# Patient Record
Sex: Female | Born: 1945 | Race: White | Hispanic: No | Marital: Single | Smoking: Former smoker
Health system: Southern US, Community
[De-identification: ages and names within clinical notes are randomized; demographics above are authoritative.]

## PROBLEM LIST (undated history)

## (undated) DIAGNOSIS — F329 Major depressive disorder, single episode, unspecified: Secondary | ICD-10-CM

## (undated) DIAGNOSIS — G2581 Restless legs syndrome: Secondary | ICD-10-CM

## (undated) DIAGNOSIS — F419 Anxiety disorder, unspecified: Secondary | ICD-10-CM

## (undated) DIAGNOSIS — F32A Depression, unspecified: Secondary | ICD-10-CM

## (undated) DIAGNOSIS — K219 Gastro-esophageal reflux disease without esophagitis: Secondary | ICD-10-CM

## (undated) HISTORY — DX: Anxiety disorder, unspecified: F41.9

## (undated) HISTORY — DX: Restless legs syndrome: G25.81

## (undated) HISTORY — PX: OTHER SURGICAL HISTORY: SHX169

---

## 2000-03-08 ENCOUNTER — Ambulatory Visit (HOSPITAL_COMMUNITY): Admission: RE | Admit: 2000-03-08 | Discharge: 2000-03-08 | Payer: Self-pay | Admitting: Gastroenterology

## 2000-03-24 ENCOUNTER — Encounter: Payer: Self-pay | Admitting: Gastroenterology

## 2000-03-24 ENCOUNTER — Ambulatory Visit (HOSPITAL_COMMUNITY): Admission: RE | Admit: 2000-03-24 | Discharge: 2000-03-24 | Payer: Self-pay | Admitting: Gastroenterology

## 2001-03-22 ENCOUNTER — Encounter: Admission: RE | Admit: 2001-03-22 | Discharge: 2001-05-03 | Payer: Self-pay | Admitting: Family Medicine

## 2001-04-24 ENCOUNTER — Encounter: Payer: Self-pay | Admitting: Occupational Medicine

## 2001-04-24 ENCOUNTER — Encounter: Admission: RE | Admit: 2001-04-24 | Discharge: 2001-04-24 | Payer: Self-pay | Admitting: Occupational Medicine

## 2001-10-23 ENCOUNTER — Ambulatory Visit (HOSPITAL_COMMUNITY): Admission: RE | Admit: 2001-10-23 | Discharge: 2001-10-23 | Payer: Self-pay | Admitting: Gastroenterology

## 2002-05-22 ENCOUNTER — Inpatient Hospital Stay (HOSPITAL_COMMUNITY): Admission: EM | Admit: 2002-05-22 | Discharge: 2002-05-27 | Payer: Self-pay | Admitting: Psychiatry

## 2002-05-28 ENCOUNTER — Other Ambulatory Visit (HOSPITAL_COMMUNITY): Admission: RE | Admit: 2002-05-28 | Discharge: 2002-06-10 | Payer: Self-pay | Admitting: Psychiatry

## 2002-06-28 ENCOUNTER — Encounter: Payer: Self-pay | Admitting: Family Medicine

## 2002-06-28 ENCOUNTER — Ambulatory Visit (HOSPITAL_COMMUNITY): Admission: RE | Admit: 2002-06-28 | Discharge: 2002-06-28 | Payer: Self-pay | Admitting: Family Medicine

## 2002-12-06 ENCOUNTER — Encounter: Payer: Self-pay | Admitting: Obstetrics and Gynecology

## 2002-12-06 ENCOUNTER — Ambulatory Visit (HOSPITAL_COMMUNITY): Admission: RE | Admit: 2002-12-06 | Discharge: 2002-12-06 | Payer: Self-pay | Admitting: Obstetrics and Gynecology

## 2003-12-12 ENCOUNTER — Other Ambulatory Visit: Admission: RE | Admit: 2003-12-12 | Discharge: 2003-12-12 | Payer: Self-pay | Admitting: Obstetrics & Gynecology

## 2004-03-01 ENCOUNTER — Other Ambulatory Visit: Admission: RE | Admit: 2004-03-01 | Discharge: 2004-03-01 | Payer: Self-pay | Admitting: Obstetrics and Gynecology

## 2004-06-06 ENCOUNTER — Emergency Department (HOSPITAL_COMMUNITY): Admission: EM | Admit: 2004-06-06 | Discharge: 2004-06-06 | Payer: Self-pay | Admitting: Family Medicine

## 2004-11-01 ENCOUNTER — Other Ambulatory Visit: Admission: RE | Admit: 2004-11-01 | Discharge: 2004-11-01 | Payer: Self-pay | Admitting: Obstetrics and Gynecology

## 2005-06-22 ENCOUNTER — Encounter: Admission: RE | Admit: 2005-06-22 | Discharge: 2005-06-22 | Payer: Self-pay | Admitting: Gastroenterology

## 2005-09-19 ENCOUNTER — Other Ambulatory Visit: Admission: RE | Admit: 2005-09-19 | Discharge: 2005-09-19 | Payer: Self-pay | Admitting: Obstetrics and Gynecology

## 2006-09-08 ENCOUNTER — Emergency Department (HOSPITAL_COMMUNITY): Admission: EM | Admit: 2006-09-08 | Discharge: 2006-09-08 | Payer: Self-pay | Admitting: Emergency Medicine

## 2007-05-04 ENCOUNTER — Encounter (INDEPENDENT_AMBULATORY_CARE_PROVIDER_SITE_OTHER): Payer: Self-pay | Admitting: Gastroenterology

## 2007-05-04 ENCOUNTER — Ambulatory Visit (HOSPITAL_COMMUNITY): Admission: RE | Admit: 2007-05-04 | Discharge: 2007-05-04 | Payer: Self-pay | Admitting: Gastroenterology

## 2007-09-17 ENCOUNTER — Ambulatory Visit (HOSPITAL_COMMUNITY): Admission: RE | Admit: 2007-09-17 | Discharge: 2007-09-17 | Payer: Self-pay | Admitting: Gastroenterology

## 2008-03-05 ENCOUNTER — Emergency Department (HOSPITAL_COMMUNITY): Admission: EM | Admit: 2008-03-05 | Discharge: 2008-03-05 | Payer: Self-pay | Admitting: Emergency Medicine

## 2008-06-06 ENCOUNTER — Emergency Department (HOSPITAL_COMMUNITY): Admission: EM | Admit: 2008-06-06 | Discharge: 2008-06-06 | Payer: Self-pay | Admitting: Emergency Medicine

## 2008-06-10 ENCOUNTER — Emergency Department (HOSPITAL_COMMUNITY): Admission: EM | Admit: 2008-06-10 | Discharge: 2008-06-10 | Payer: Self-pay | Admitting: *Deleted

## 2008-10-15 ENCOUNTER — Inpatient Hospital Stay (HOSPITAL_COMMUNITY): Admission: RE | Admit: 2008-10-15 | Discharge: 2008-10-16 | Payer: Self-pay | Admitting: Obstetrics and Gynecology

## 2008-10-15 ENCOUNTER — Encounter (INDEPENDENT_AMBULATORY_CARE_PROVIDER_SITE_OTHER): Payer: Self-pay | Admitting: Obstetrics and Gynecology

## 2009-02-07 ENCOUNTER — Emergency Department (HOSPITAL_COMMUNITY): Admission: EM | Admit: 2009-02-07 | Discharge: 2009-02-07 | Payer: Self-pay | Admitting: Emergency Medicine

## 2009-06-13 ENCOUNTER — Emergency Department (HOSPITAL_COMMUNITY): Admission: EM | Admit: 2009-06-13 | Discharge: 2009-06-13 | Payer: Self-pay | Admitting: Emergency Medicine

## 2010-01-03 ENCOUNTER — Emergency Department (HOSPITAL_COMMUNITY): Admission: EM | Admit: 2010-01-03 | Discharge: 2010-01-04 | Payer: Self-pay | Admitting: Emergency Medicine

## 2010-07-14 ENCOUNTER — Emergency Department (HOSPITAL_COMMUNITY)
Admission: EM | Admit: 2010-07-14 | Discharge: 2010-07-14 | Payer: Self-pay | Source: Home / Self Care | Admitting: Internal Medicine

## 2010-08-05 ENCOUNTER — Other Ambulatory Visit: Payer: Self-pay | Admitting: Obstetrics and Gynecology

## 2010-10-09 LAB — URINALYSIS, ROUTINE W REFLEX MICROSCOPIC
Glucose, UA: NEGATIVE mg/dL
Ketones, ur: NEGATIVE mg/dL
Protein, ur: 30 mg/dL — AB
pH: 5.5 (ref 5.0–8.0)

## 2010-10-09 LAB — URINE MICROSCOPIC-ADD ON

## 2010-10-09 LAB — URINE CULTURE

## 2010-10-13 LAB — CBC
Hemoglobin: 15.3 g/dL — ABNORMAL HIGH (ref 12.0–15.0)
MCHC: 34.3 g/dL (ref 30.0–36.0)
MCHC: 34.9 g/dL (ref 30.0–36.0)
MCV: 95.4 fL (ref 78.0–100.0)
MCV: 95.9 fL (ref 78.0–100.0)
Platelets: 192 10*3/uL (ref 150–400)
RBC: 4.66 MIL/uL (ref 3.87–5.11)
RDW: 13.9 % (ref 11.5–15.5)
RDW: 14.1 % (ref 11.5–15.5)

## 2010-10-13 LAB — COMPREHENSIVE METABOLIC PANEL
ALT: 16 U/L (ref 0–35)
AST: 21 U/L (ref 0–37)
Albumin: 2.6 g/dL — ABNORMAL LOW (ref 3.5–5.2)
CO2: 32 mEq/L (ref 19–32)
Calcium: 8 mg/dL — ABNORMAL LOW (ref 8.4–10.5)
Creatinine, Ser: 0.76 mg/dL (ref 0.4–1.2)
GFR calc Af Amer: >60 mL/min (ref >60)
GFR calc non Af Amer: >60 mL/min (ref >60)
Sodium: 139 mEq/L (ref 135–145)
Total Protein: 5.3 g/dL — ABNORMAL LOW (ref 6.0–8.3)

## 2010-10-28 ENCOUNTER — Emergency Department (HOSPITAL_COMMUNITY)
Admission: EM | Admit: 2010-10-28 | Discharge: 2010-10-28 | Disposition: A | Discharge status: Home / Self Care | Payer: Self-pay | Attending: Emergency Medicine | Admitting: Emergency Medicine

## 2010-10-28 DIAGNOSIS — J309 Allergic rhinitis, unspecified: Secondary | ICD-10-CM | POA: Insufficient documentation

## 2010-10-28 DIAGNOSIS — J069 Acute upper respiratory infection, unspecified: Secondary | ICD-10-CM | POA: Insufficient documentation

## 2010-10-28 DIAGNOSIS — F411 Generalized anxiety disorder: Secondary | ICD-10-CM | POA: Insufficient documentation

## 2010-11-16 NOTE — Op Note (Signed)
NAME:  Yvonne Stewart, Yvonne Stewart NO.:  [id number]   MEDICAL RECORD NO.:  [id number]          PATIENT TYPE:  INP   LOCATION:  9309                          FACILITY:  [facility]   PHYSICIAN:  Carrington Clamp, M.D. DATE OF BIRTH:  12-26-1945   DATE OF PROCEDURE:  10/15/2008  DATE OF DISCHARGE:                               OPERATIVE REPORT   PREOPERATIVE DIAGNOSIS:  Stress urinary incontinence with postmenopausal  bleeding.   POSTOPERATIVE DIAGNOSIS:  Stress urinary incontinence with  postmenopausal bleeding.   PROCEDURE:  Total vaginal hysterectomy with tension-free vaginal tape,  anterior repair, and cystoscopy.   SURGEON:  Carrington Clamp, MD   ASSISTANT:  Dr. Edward Jolly.   ANESTHESIA:  General.   FINDINGS:  Normal tubes, ovaries, and uterus.  There was bilateral spill  of indigo carmine from each ureteral orifice.  There were no needles in  the bladder post the second pass on the right side.   SPECIMENS:  Uterus and cervix.   ESTIMATED BLOOD LOSS:  250 mL.   INTRAVENOUS FLUIDS:  2000 mL.   URINE OUTPUT:  Not measured.   COMPLICATIONS:  Right dome perforation of the bladder with the Gynecare  abdominal needle after the first pass.  There was some bleeding but it  was minimal from this site once the needle was replaced.  There was no  other complications.  The needle was replaced and there were no needles  or tape in the bladder postoperatively.   MEDICATIONS:  Enfamil in the bladder, indigo carmine, and 1% lidocaine  with epi.   COUNTS:  Correct x3.   TECHNIQUE:  After adequate general anesthesia was achieved, the patient  was prepped and draped in usual sterile fashion in dorsal lithotomy  position.  The bladder was emptied with a red rubber catheter and 60 mL  of Enfamil was instilled into the bladder.  The short duckbill retractor  was placed in the vagina and the cervix was grasped with a pair of  Laheys.  Approximately 5 mL of lidocaine with epi was  injected  circumferentially around the cervix at the reflection of the bladder off  the vagina onto the cervix.  A circumferential incision was made with  the scalpel and the posterior cul-de-sac was entered into with the Mayo  scissors without complications.  The long duckbill retractor was placed  and the dissection was performed anteriorly along the vesicouterine  fascia until the anterior peritoneum could be identified.  Heaneys were  placed bilaterally on the uterosacral ligaments and each pedicle was  incised with the Mayo scissors and secured with a Heaney stitch of 0  Vicryl.  The anterior peritoneum was then entered into with Metzenbaum  scissors and the Deaver placed to retract the bladder out of the way.   Alternating successive bites of the Heaney clamp were then performed  along the cardinal and broad ligaments.  Each pedicle was incised with  the Mayo scissors and secured with a stitch of 0 Vicryl.  At the level  of the cornua, Heaneys were placed bilaterally and the utero-ovarian  ligaments were then incised with Mayo scissors.  Each of these pedicles  were  secured with a freehand tie of 0 Vicryl followed by a stitch of 0  Vicryl.  The ovaries were able to be seen in the field and were very  small and very normal looking and therefore were left in situ.  The  pedicles bilaterally were checked and 2 additional figure-of-eight  stitches were placed on each side of the upper portion of the cardinal  ligament to ensure hemostasis.  This was performed very shallowly to  avoid kinking of the ureters.  The anterior peritoneum was then grasped  with the Kelly clamp and the peritoneum was closed in the following  manner:  The anterior peritoneum was then secured with a stitch of 2-0  Vicryl which then incorporated the left uterosacral, incorporated the  peritoneum of the posterior cul-de-sac about 2 cm away from the vaginal  cuff to perform a modified Holden, the right uterosacral  and back  through the anterior peritoneum.  This stitch was cinched down in a  pursestring fashion to close the peritoneum.   The posterior cuff was bleeding pretty actively and therefore the cuff  was run with a running lock stitch of 2-0 Vicryl.  Once hemostasis was  achieved, attention was then turned to the anterior vaginal wall.  The  top of the cuff was grasped with a pair of Allis' and multiple Lavena Bullion'  were used to identify the midline of the vaginal mucosa underneath the  urethra.  Another additional lidocaine with epi was then injected in  this area to begin to create the plane.  The scalpel was then used to  incise the midline while the Allis' were used to retract the edges of  the vaginal mucosa as they were incised.  The vesicovaginal fascia was  then excised off of the vaginal mucosa with careful dissection with the  Metzenbaum scissors bilaterally.  This was performed until we could feel  underneath the pubic bone on either side of the urethra.  The Gynecare  tension-free vaginal tape was then opened up and 2 stab incisions with a  15 scalpel were made on either side of the midline just above the pubic  bone.  The abdominal needles were passed trying to ride closely to the  posterior face of the pubic bone.  The Foley had been placed and moved  around appropriately during this procedure.  The Foley was then removed  and cystoscopy performed.  Bilateral spill of indigo carmine was seen  from each ureteral orifice and the patient's left needle was clear of  the bladder.  However, the right needle did perforate the bladder in 2  points.  This needle was withdrawn and replaced carefully and cystoscopy  performed again after the Foley had been replaced and then again removed  and the perforation sites could be seen bleeding just very minimally,  but the needles were no longer inside the bladder.  The vaginal needles  were then attached to abdominal needles and the tension-free  vaginal  tape through and underneath the urethra close to the bladder neck  hopefully little bit closure to mid urethra.  Each of the pieces of the  tape were then cut at the skin level and allowed to fall back.  This was  performed with sufficient tension to allow a Kelly to be underneath the  bladder neck.  Once the tape was checked and made sure that it was lying  flat and in correct position, an anterior repair was performed with 2  mattress stitches of  0 Vicryl.  An additional stitch of 2-0 Vicryl was  performed up towards the patient's left underneath the pubic bone by  where the tension-free vaginal tape was in order to assure hemostasis.  The vaginal mucosa was then trimmed bilaterally and the vaginal mucosa  was then closed with a running lock stitch of 2-0 Vicryl.   The cuff was then closed with multiple figure-of-eight stitches of 0  Vicryl.  A vaginal pack with Premarin cream was placed in the vagina and  will be remained in overnight.  This was placed in order to ensure  hemostasis of some oozing of the anterior repair.  The Foley had been  replaced, and because of the perforation, the bladder will have the  Foley for at least a week and then undergo a voiding trial after that.      Carrington Clamp, M.D.  Electronically Signed     MH/MEDQ  D:  10/15/2008  T:  10/16/2008  Job:  [id number]

## 2010-11-16 NOTE — Op Note (Signed)
NAME:  Yvonne Stewart, Yvonne Stewart         ACCOUNT NO.:  [id number]   MEDICAL RECORD NO.:  [id number]          PATIENT TYPE:  AMB   LOCATION:  ENDO                         [facility]ACILITY:  [facility]   PHYSICIAN:  Anselmo Rod, M.D.  DATE OF BIRTH:  19-Jan-1946   DATE OF PROCEDURE:  05/04/2007  DATE OF DISCHARGE:                               OPERATIVE REPORT   PROCEDURE:  Esophagogastroduodenoscopy with multiple cold biopsies.   ENDOSCOPIST:  Anselmo Rod, M.D.   INSTRUMENT USED:  Pentax video panendoscope.   INDICATIONS FOR PROCEDURE:  Patient is a 65 year old white female with a  history of reflux and dysphagia, undergoing EGD to rule out esophagitis,  peptic stricture, esophageal dilatation planned, if needed.   PRE-PROCEDURE PREPARATION:  Informed consent was procured from the  patient.  The patient fasted for 12 hours prior to procedure.  The risks  and benefits of the procedure, including risks of bleeding, etc., were  discussed with the patient in great deal.   PRE-PROCEDURE PHYSICAL:  Patient had stable vital signs.  NECK:  Supple.  CHEST:  Clear to auscultation, S1, S2 regular.  ABDOMEN:  Soft with normal bowel sounds.   DESCRIPTION OF PROCEDURE:  The patient was placed in left lateral  decubitus position and sedated with 100 micrograms of Fentanyl and 7 mg  of Versed, given intravenously in slow, incremental doses.  Once the  patient was adequately sedated, maintained on low-flow oxygen and  continuous cardiac monitoring, the Pentax video panendoscope was  advanced through the mouth piece, over the tongue, into the esophagus  under direct vision.  The entire esophagus was widely patent with no  evidence of ring, stricture, masses, esophagitis, or Barrett's mucosa.  The Z-line appeared healthy.  The vocal cords appeared healthy when the  scope was initially passed through the UES, and the scope was then  advanced into the stomach.  A small sessile gastric polyp was biopsied  from  the high cardia.  Retroflexion in the high cardia revealed no other  abnormalities.  The entire gastric mucosa in the proximal small bowel  appeared normal.  There was no outlet obstruction.  The patient  tolerated the procedure well, without immediate complications.  Distal  esophageal biopsies were done to rule out eosinophilic esophagitis.   IMPRESSION:  1. Healthy-appearing vocal cords.  2. Widely patent esophagus.  Distal esophageal biopsies done to rule      out eosinophilic esophagitis.  3. Healthy-appearing gastric mucosa, except for a small sessile polyp,      biopsied from the high cardia.  4. Normal proximal small bowel.   RECOMMENDATIONS:  1. Await pathology results.  2. Avoid all nonsteroidals, including aspirin for the next two weeks.  3. Outpatient followup in the next two weeks for further      recommendations.      Anselmo Rod, M.D.  Electronically Signed     JNM/MEDQ  D:  05/04/2007  T:  05/04/2007  Job:  [id number]

## 2010-11-19 NOTE — Op Note (Signed)
[facility]  Patient:    Yvonne Stewart, Yvonne Stewart Visit Number: [id number] MRN: [id number]          Service Type: END Location: ENDO Attending Physician:  Charna Elizabeth Dictated by:   Anselmo Rod, M.D. Proc. Date: 10/23/01 Admit Date:  10/23/2001   CC:         Arlyce Harman, M.D., [facility], [place]   Operative Report  DATE OF BIRTH:  08/24/1945  PROCEDURE PERFORMED:  Colonoscopy.  ENDOSCOPIST:  Anselmo Rod, M.D.  INSTRUMENT:  Olympus video colonoscope.  INDICATION FOR PROCEDURE:  Rectal bleeding in a 65 year old white female. Rule out colonic polyps, masses, hemorrhoids, etc.  PREPROCEDURE PREPARATION:  Informed consent was procured from the patient. The patient was fasting for eight hours prior to the procedure and prepped with a bottle of magnesium citrate and a gallon of NuLytely the night prior to the procedure.  PREPROCEDURE PHYSICAL:  VITAL SIGNS:  Stable.  NECK:  Supple.  CHEST:  Clear to auscultation.  HEART:  S1, S2 regular.  ABDOMEN:  Soft with normal abdominal bowel sounds.  DESCRIPTION OF THE PROCEDURE:  The patient was placed in the left lateral decubitus position and sedated with 75 mg of Demerol and 7.5 mg of Versed intravenously.  Once the patient was adequately sedated and maintained on low-flow oxygen and continuous cardiac monitoring, the Olympus video colonoscope was advanced from the rectum to the cecum without difficulty. The entire colonic mucosa appeared healthy with normal vascular pattern. No erosions, ulcerations, masses, or polyps were seen.  Small internal hemorrhoids were appreciated on retroflexion it the rectum.  IMPRESSION: 1. Healthy-appearing colon to the cecum.  Appendiceal orifice and ileocecal    valve clearly visualized and photographed. 2. Small nonbleeding internal hemorrhoids.  RECOMMENDATIONS: 1. A high-fiber diet has been discuss with the patient in great  detail. 2. Repeat colorectal screening is recommended in the next five years unless    the patient develops abnormal symptoms in the interim. Dictated by:   Anselmo Rod, M.D. Attending Physician:  Charna Elizabeth DD:  10/23/01 TD:  10/23/01 Job: [id number]

## 2010-11-19 NOTE — H&P (Signed)
NAME:  Yvonne Stewart, Yvonne Stewart NO.:  [id number]   MEDICAL RECORD NO.:  [id number]                   PATIENT TYPE:  IPS   LOCATION:  0502                                 FACILITY:  [facility]   PHYSICIAN:  Geoffery Lyons, M.D.                   DATE OF BIRTH:  1945-11-05   DATE OF ADMISSION:  05/22/2002  DATE OF DISCHARGE:                         PSYCHIATRIC ADMISSION ASSESSMENT   IDENTIFYING INFORMATION:  This is a 65 year old white female who is divorced  and a voluntary admission.  Chief complaint:  I can't stop taking the  Ultram.   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  She had been seen there 1 time because she  felt anxious and dependent on taking Ultram 100 mg q.h.s. for the past 2  months.  Her primary care Narmeen Kerper, Dalphine Handing, P.A., had referred her  there because she felt that in reviewing her use of the Ultram she had  control issues.  The patient was originally prescribed Ultram some months  ago after a back injury and continued to take the Ultram, as she described  it, long after her back pain was resolved, which was a little over 2 months  ago.  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  She worries  obsessively, mostly at bedtime when she lays down to go to sleep and if she  does not take the Ultram she feels that she gets more anxious.  The patient  denies any suicidal ideation.  She does endorse occasional feelings of  intense anger, especially during work hours, sometimes feeling that she is  unable to tolerate patient demands in her nursing work and that at times she  has felt that she might want to harm patients.   PAST PSYCHIATRIC  HISTORY:  The patient is followed by Valinda Hoar, NP, at  Dr. Gregary Cromer office.  This is her first admission to [facility].  She has a history of hospitalization 1 time  previously at age 58 for acute anxiety and depression when she was a new  mother, feeling overwhelmed by child care and family respirabilities.  The  patient has been taking Paxil on and off over the past 5 years or so for  episodes when she would become anxious.  She tends to take it episodically  rather than taking it regularly but felt that she has had a good response  from it.   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  She has been divorced for approximately 6 years and currently  lives alone.  She has  2 grown children, denies any legal problems.   FAMILY HISTORY:  Remarkable for a father with a history of alcohol abuse  problems.   ALCOHOL AND DRUG HISTORY:  None.  The patient denies any history of  substance abuse.   PAST MEDICAL HISTORY:  The patient is followed by Dalphine Handing, PA, at  Urgent Care on [address]  Medical problems are genital herpes and  postmenopausal state.   MEDICATIONS:  Actavela 0.5 mg p.o. daily, Acyclovir 400 mg daily, Ultram 75  mg daily, and Restoril 7.5 mg p.o. q.h.s.  The patient has been taking  Ultram 75-100 mg q.h.s.  Her primary care Kasey Hansell has been attempting to  wean her off of this.  She takes no more than that and no evidence that she  is using or abusing any other medications.   POSITIVE PHYSICAL FINDINGS:  REVIEW OF SYSTEMS:  Remarkable for some vaginal  dryness and the patient reports using hydrocortisone cream on her labia to  alleviate this.  She is a nonsmoker.   PHYSICAL EXAMINATION:  A well-nourished, well-developed, petite female, who  appears to be her stated age of 27.  Vital signs within normal limits.  She  is 5 feet tall and weighs 122 pounds at the time of  admission.  GENERAL APPEARANCE:  that of a well-nourished female who is fully alert.  Hygiene adequate.  HEAD:  Atraumatic, normocephalic.  EENT:  Grossly normal exam.  Hearing  intact to normal voice.  Sclerae non-icteric.  NECK:  Supple, no thyromegaly.  CARDIOVASCULAR:  Normal heart sounds, synchronous with radial pulse, regular  rate.  LUNGS:  Clear.  ABDOMEN:  Flat, soft and quiet.  GENITALIA:  Not examined.  BREAST EXAM:  Not done.  MUSCULOSKELETAL:  Full range of motion all joints, no evidence of any  erythema or restrictions.  Posture is upright, gait grossly normal.  NEURO:  Cranial nerves II-XII intact.  Ocular tracking normal.  EOMs intact.  Balance is good.  Motor movements are smooth.  Grip strength symmetrical.  No focal findings.   LABORATORY DATA:  Diagnostic studies reveal normal CBC, hemoglobin 14.9,  hematocrit 44.4.  Electrolytes are within normal limits.  Liver enzymes are  normal.  CMET is generally unremarkable.  Normal TSH of 3.511.  Urinalysis  and urine drug screen are currently pending.   MENTAL STATUS EXAM:  This is an alert, petite female with a blunted, mildly  anxious affect.  She is generally cooperative and pleasant but does appear  to be mildly anxious, a bit hyperalert.  Speech is normal.  Mood anxious, no  evidence of overt guarding though.  Generally, she is pleasant, cooperative.  Thought process is logical, no evidence of suicidal ideation or homicidal  ideation, no auditory or visual hallucinations, no evidence of psychosis or  paranoia.  Cognitively she is intact and oriented x3.   ADMISSION DIAGNOSIS:   AXIS I:  1. Anxiety disorder not otherwise specified.  2. Rule out obsessive-compulsive disorder.  3. Rule out opiate dependence.   AXIS II:  Deferred.   AXIS III:  Genital herpes and postmenopausal state.   AXIS IV:  Moderate, work stress.   AXIS V:  Current 36, past year 28.  INITIAL PLAN OF CARE:  Voluntarily admit the patient  to evaluate her anxiety  and her anger with vague, fleeting homicidal ideation over the past 2-3  weeks, with agitation, and secondarily to evaluate her use of this Ultram  and possible need for detox.  We are not going to do the Clonidine or  Wytensin protocol on her at this point.  We did do a short trial of that but  it lowered her blood pressure severely and she has no clear signs of  withdrawal when she does not take it.  We have elected to start her on Paxil  12.5 mg CR daily and she is tolerating this well and we will  also add trazodone 50 mg p.o. q.h.s. and we have chosen to continue her  Restoril 15 mg at h.s. along with her routine medications.   ESTIMATED LENGTH OF STAY:  3-4 days.      Margaret A. Scott, N.P.                   Geoffery Lyons, M.D.    MAS/MEDQ  D:  05/23/2002  T:  05/23/2002  Job:  [id number]

## 2010-11-19 NOTE — Discharge Summary (Signed)
NAME:  Yvonne Stewart, Yvonne Stewart NO.:  [id number]   MEDICAL RECORD NO.:  [id number]                   PATIENT TYPE:  IPS   LOCATION:  0502                                 FACILITY:  [facility]   PHYSICIAN:  Geoffery Lyons, M.D.                   DATE OF BIRTH:  05/04/1946   DATE OF ADMISSION:  05/22/2002  DATE OF DISCHARGE:  05/27/2002                                 DISCHARGE SUMMARY   CHIEF COMPLAINT AND PRESENTING ILLNESS:  This was first admission  to [facility]  for this 65 year old white female, divorced,  voluntary admission, claiming that she could not stop the Ultram, referred  by Dr. Nolen Mu for detox.  Her primary care Raevon Broom's PA referred for  psychiatric treatment and she had control issues.  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  Unable to tolerate things such as traffic slowdowns and other daily  irritations.  Worries obsessively all the time.   PAST PSYCHIATRIC HISTORY:  Valinda Hoar, nurse practitioner with Dr.  Emerson Monte.  First time at [facility].  Hospitalized at  age 59 due to acute anxiety and depression.   ALCOHOL AND DRUG HISTORY:  Denies the use or abuse of any substances.   PAST MEDICAL HISTORY:  Genital herpes and post menopausal state.   MEDICATIONS:  Activella 0.5 every day, Acylovir 400 mg daily, Ultram 75  daily, Restoril 7.5 at bedtime.   PHYSICAL EXAMINATION:  Performed, failed to show any acute findings.   MENTAL STATUS EXAM:  Reveals an alert, petite female, blunted, mildly  anxious affect, generally cooperative, some pressure, does appear to be  mildly anxious, hyperalert.  Speech normal, mood anxious, no evidence of  overt guarded thoughts, generally pleasant, cooperative.  Thought processes  logical, no evidence of suicidal ideas or homicidal ideas, no  auditory or  visual hallucinations, no psychosis or paranoia.  Cognition well preserved.   ADMISSION DIAGNOSES:   AXIS I:  1. Anxiety disorder not otherwise specified.  2. Rule out opiate abuse.   AXIS II:  No diagnosis.   AXIS III:  Genital herpes, post menopausal state.   AXIS IV:  Moderate.   AXIS V:  Global assessment of function upon admission 36, highest global  assessment of function in past year 75.   COURSE IN HOSPITAL:  She was admitted and started on intensive individual  and group psychotherapy.  She was detoxified using Guaranine.  She was given  Paxil CR 12.5 mg per day.  Paxil was increased to 25 mg per day.  In group  and individual sessions she was able to gain more insight in terms of her  issues, the need to pursue further outpatient treatment, see a  psychotherapist.  She persistently denied suicidal ideas, homicidal ideas.  There  was no evidence of active withdrawal, so physical dependence to the  medication was not evident.  She said she was sleeping better without the  Ultram, so it was felt that she had obtained full benefit from the  hospitalization, and she was discharged to outpatient followup.   DISCHARGE DIAGNOSES:   AXIS I:  Anxiety disorder not otherwise specified.   AXIS II:  No diagnosis.   AXIS III:  Genital herpes.   AXIS IV:  Moderate.   AXIS V:  Global assessment of function upon discharge 60.   DISCHARGE MEDICATIONS:  1. Acylovir 200 mg 2 daily.  2. Activella 0.5 daily.  3. Hydrocortisone cream applied to area.  4. Paxil CR 25 mg daily.   DISPOSITION:  Pursue treatment with [facility] as well as  Gwen Old for individual therapy.                                                Geoffery Lyons, M.D.    IL/MEDQ  D:  07/09/2002  T:  07/09/2002  Job:  [id number]

## 2010-11-19 NOTE — Op Note (Signed)
[place]. [facility]  Patient:    Yvonne Stewart, Yvonne Stewart                  MRN: [id number] Proc. Date: 03/08/00 Adm. Date:  [id number] Attending:  Charna Elizabeth CC:         Dr. Clement Sayres   Operative Report  DATE OF BIRTH:  [birth year]  REFERRING PHYSICIAN:  Dr. Clement Sayres.  PROCEDURE PERFORMED:  Esophagogastroduodenoscopy.  ENDOSCOPIST:  Anselmo Rod, M.D.  INSTRUMENT USED:  Olympus video panendoscope.  INDICATIONS FOR PROCEDURE:  Epigastric pain not responding to proton pump inhibitors in a 65 year old white female rule out peptic ulcer disease, esophagitis, etc.  PREPROCEDURE PREPARATION:  Informed consent was procured from the patient. The patient was fasted for eight hours prior to the procedure.  PREPROCEDURE PHYSICAL:  The patient had stable vital signs.  Neck supple. Chest clear to auscultation.  S1, S2 regular.  Abdomen soft with epigastric tenderness on palpation wtih guarding, no rebound, no rigidity, no hepatosplenomegaly.  DESCRIPTION OF PROCEDURE:  The patient was placed in left lateral decubitus position and sedated with 60 mg of Demerol and 6 mg of Versed intravenously. Once the patient was adequately sedated and maintained on low-flow oxygen and continuous cardiac monitoring, the Olympus video panendoscope was advanced through the mouthpiece, over the tongue, into the esophagus under direct vision.  The entire esophagus appeared normal without evidence of ring, stricture, masses, lesions, esophagitis or Barretts mucosa.  The scope was then advanced into the stomach.  The entire gastric mucosa appeared healthy. There was no evidence of hiatal hernia on high retroflexion.  The duodenal bulb and the small bowel distal to the bulb up to 70 cm appeared normal. There was no outlet obstruction.  The patient tolerated the procedure well without complication.  IMPRESSION:  Normal esophagogastroduodenoscopy.  RECOMMENDATION: 1.  Proceed with abdominal ultrasound and HIDA scan, rule out gallbladder    pathology. 2. Continue proton pump inhibitors for now. 3. Avoid all nonsteroidals. 4. Outpatient follow-up in the next 7 to 10 days. DD:  03/08/00 TD:  03/08/00 Job: [id number]

## 2011-03-07 ENCOUNTER — Emergency Department (HOSPITAL_COMMUNITY)
Admission: EM | Admit: 2011-03-07 | Discharge: 2011-03-08 | Disposition: A | Discharge status: Home / Self Care | Payer: Self-pay | Attending: Emergency Medicine | Admitting: Emergency Medicine

## 2011-03-07 DIAGNOSIS — B356 Tinea cruris: Secondary | ICD-10-CM | POA: Insufficient documentation

## 2011-04-08 LAB — POCT I-STAT, CHEM 8
BUN: 14 mg/dL (ref 6–23)
Chloride: 106 mEq/L (ref 96–112)
Glucose, Bld: 130 mg/dL — ABNORMAL HIGH (ref 70–99)
HCT: 37 % (ref 36.0–46.0)
Potassium: 3.4 mEq/L — ABNORMAL LOW (ref 3.5–5.1)

## 2011-05-03 ENCOUNTER — Other Ambulatory Visit: Payer: Self-pay | Admitting: Obstetrics and Gynecology

## 2011-08-10 ENCOUNTER — Emergency Department (HOSPITAL_COMMUNITY)
Admission: EM | Admit: 2011-08-10 | Discharge: 2011-08-10 | Disposition: A | Discharge status: Home / Self Care | Payer: Self-pay | Attending: Emergency Medicine | Admitting: Emergency Medicine

## 2011-08-10 ENCOUNTER — Encounter (HOSPITAL_COMMUNITY): Payer: Self-pay | Admitting: *Deleted

## 2011-08-10 ENCOUNTER — Emergency Department (HOSPITAL_COMMUNITY): Payer: Self-pay

## 2011-08-10 DIAGNOSIS — R110 Nausea: Secondary | ICD-10-CM | POA: Insufficient documentation

## 2011-08-10 DIAGNOSIS — G43909 Migraine, unspecified, not intractable, without status migrainosus: Secondary | ICD-10-CM | POA: Insufficient documentation

## 2011-08-10 DIAGNOSIS — Z79899 Other long term (current) drug therapy: Secondary | ICD-10-CM | POA: Insufficient documentation

## 2011-08-10 DIAGNOSIS — H538 Other visual disturbances: Secondary | ICD-10-CM | POA: Insufficient documentation

## 2011-08-10 LAB — URINALYSIS, ROUTINE W REFLEX MICROSCOPIC
Bilirubin Urine: NEGATIVE
Glucose, UA: NEGATIVE mg/dL
Ketones, ur: NEGATIVE mg/dL
pH: 6 (ref 5.0–8.0)

## 2011-08-10 LAB — DIFFERENTIAL
Basophils Relative: 0 % (ref 0–1)
Eosinophils Relative: 3 % (ref 0–5)
Lymphocytes Relative: 35 % (ref 12–46)
Monocytes Absolute: 0.6 10*3/uL (ref 0.1–1.0)
Monocytes Relative: 8 % (ref 3–12)
Neutro Abs: 4.4 10*3/uL (ref 1.7–7.7)

## 2011-08-10 LAB — BASIC METABOLIC PANEL
BUN: 12 mg/dL (ref 6–23)
CO2: 21 mEq/L (ref 19–32)
Chloride: 102 mEq/L (ref 96–112)
Creatinine, Ser: 0.74 mg/dL (ref 0.50–1.10)

## 2011-08-10 LAB — CBC
HCT: 40.2 % (ref 36.0–46.0)
Hemoglobin: 14.1 g/dL (ref 12.0–15.0)
MCHC: 35.1 g/dL (ref 30.0–36.0)
MCV: 91.2 fL (ref 78.0–100.0)

## 2011-08-10 MED ORDER — KETOROLAC TROMETHAMINE 30 MG/ML IJ SOLN
30.0000 mg | Freq: Once | INTRAMUSCULAR | Status: AC
  Administered 2011-08-10: 30 mg via INTRAVENOUS
  Filled 2011-08-10: qty 1

## 2011-08-10 MED ORDER — METOCLOPRAMIDE HCL 5 MG/ML IJ SOLN
10.0000 mg | Freq: Once | INTRAMUSCULAR | Status: AC
  Administered 2011-08-10: 10 mg via INTRAVENOUS
  Filled 2011-08-10: qty 2

## 2011-08-10 MED ORDER — DIPHENHYDRAMINE HCL 50 MG/ML IJ SOLN
25.0000 mg | Freq: Once | INTRAMUSCULAR | Status: DC
  Filled 2011-08-10: qty 1

## 2011-08-10 MED ORDER — SODIUM CHLORIDE 0.9 % IV BOLUS (SEPSIS)
1000.0000 mL | Freq: Once | INTRAVENOUS | Status: AC
  Administered 2011-08-10: 1000 mL via INTRAVENOUS

## 2011-08-10 NOTE — ED Notes (Signed)
Pt in c/o headache since this afternoon and also blurred vision, pt states she does not typically get headaches

## 2011-08-10 NOTE — ED Provider Notes (Signed)
History     CSN: [id number]  Arrival date & time 08/10/11  1913   First MD Initiated Contact with Patient 08/10/11 1945      Chief Complaint  Patient presents with  . Headache  . Blurred Vision    (Consider location/radiation/quality/duration/timing/severity/associated sxs/prior treatment) HPI Comments: No prior hx of HA. Denies paresthesias. Had some visual changes which she describes as blurred vision which has since resolved. Headache is bilateral and frontal  Patient is a 66 y.o. female presenting with headaches. The history is provided by the patient. No language interpreter was used.  Headache  This is a new problem. The current episode started 3 to 5 hours ago. The problem occurs constantly. The problem has been gradually improving. Associated with: staring at a computer screen. The pain is located in the frontal region. The quality of the pain is described as throbbing. The pain is moderate. The pain does not radiate. Associated symptoms include nausea. Pertinent negatives include no anorexia, no fever, no malaise/fatigue, no near-syncope, no orthopnea, no palpitations, no syncope, no shortness of breath and no vomiting. She has tried acetaminophen for the symptoms. The treatment provided moderate relief.    History reviewed. No pertinent past medical history.  History reviewed. No pertinent past surgical history.  History reviewed. No pertinent family history.  History  Substance Use Topics  . Smoking status: Not on file  . Smokeless tobacco: Not on file  . Alcohol Use: Not on file    OB History    Grav Para Term Preterm Abortions TAB SAB Ect Mult Living                  Review of Systems  Constitutional: Negative for fever, chills, malaise/fatigue, activity change, appetite change and fatigue.  HENT: Negative for congestion, sore throat, rhinorrhea, neck pain and neck stiffness.   Eyes: Positive for visual disturbance. Negative for photophobia, pain and redness.    Respiratory: Negative for cough and shortness of breath.   Cardiovascular: Negative for chest pain, palpitations, orthopnea, syncope and near-syncope.  Gastrointestinal: Positive for nausea. Negative for vomiting, abdominal pain and anorexia.  Genitourinary: Negative for dysuria, urgency, frequency and flank pain.  Musculoskeletal: Negative for myalgias, back pain and arthralgias.  Neurological: Positive for headaches. Negative for dizziness, weakness, light-headedness and numbness.  All other systems reviewed and are negative.    Allergies  Review of patient's allergies indicates no known allergies.  Home Medications   Current Outpatient Rx  Name Route Sig Dispense Refill  . ACETAMINOPHEN 500 MG PO TABS Oral Take 1,000 mg by mouth every 6 (six) hours as needed. headache    . ACYCLOVIR 400 MG PO TABS Oral Take 400 mg by mouth daily.    Marland Kitchen CITALOPRAM HYDROBROMIDE 40 MG PO TABS Oral Take 40 mg by mouth daily.    Marland Kitchen ESTRADIOL 1 MG PO TABS Oral Take 0.5 mg by mouth daily.    Marland Kitchen PANTOPRAZOLE SODIUM 40 MG PO TBEC Oral Take 40 mg by mouth daily.    Marland Kitchen PRAMIPEXOLE DIHYDROCHLORIDE 0.5 MG PO TABS Oral Take 0.5 mg by mouth daily.      BP 151/81  Pulse 76  Temp(Src) 98 F (36.7 C) (Oral)  Resp 20  SpO2 100%  Physical Exam  Nursing note and vitals reviewed. Constitutional: She is oriented to person, place, and time. She appears well-developed and well-nourished. No distress.  HENT:  Head: Normocephalic and atraumatic.  Mouth/Throat: Oropharynx is clear and moist.  No sinus tenderness  Eyes: Conjunctivae and EOM are normal. Pupils are equal, round, and reactive to light.  Neck: Normal range of motion. Neck supple.  Cardiovascular: Normal rate, regular rhythm, normal heart sounds and intact distal pulses.  Exam reveals no gallop and no friction rub.   No murmur heard. Pulmonary/Chest: Effort normal and breath sounds normal. No respiratory distress.  Abdominal: Soft. Bowel sounds are  normal. There is no tenderness.  Musculoskeletal: Normal range of motion. She exhibits no tenderness.  Lymphadenopathy:    She has no cervical adenopathy.  Neurological: She is alert and oriented to person, place, and time. She has normal strength and normal reflexes. No cranial nerve deficit or sensory deficit. Coordination normal.  Skin: Skin is warm and dry. No rash noted.    ED Course  Procedures (including critical care time)  Labs Reviewed  BASIC METABOLIC PANEL - Abnormal; Notable for the following:    GFR calc non Af Amer 89 (*)    All other components within normal limits  CBC  DIFFERENTIAL  URINALYSIS, ROUTINE W REFLEX MICROSCOPIC   Ct Head Wo Contrast  08/10/2011  *RADIOLOGY REPORT*  Clinical Data: Headache  CT HEAD WITHOUT CONTRAST  Technique:  Contiguous axial images were obtained from the base of the skull through the vertex without contrast.  Comparison: None.  Findings: No skull fracture is noted.  Paranasal sinuses and mastoid air cells are unremarkable.  No intracranial hemorrhage, mass effect or midline shift.  No acute infarction.  No mass lesion is noted on this unenhanced scan.  No intra or extra-axial fluid collection.  The gray and white matter differentiation is preserved.  IMPRESSION: No acute intracranial abnormality.  Original Report Authenticated By: Natasha Mead, M.D.     1. Migraine headache       MDM  Migraine-type headache. Patient's symptoms have resolved. Her headache improved after migraine cocktail. CT negative. Headache was gradual in onset as was concerned about subarachnoid hemorrhage as the cause of her symptoms. She no additional neurologic symptoms we'll have no concern about TIA. Her vision change with diffuse described as blurred therefore concerned about any amaurosis picture. Provided clear signs and symptoms for which to return        Dayton Bailiff, MD 08/10/11 2158

## 2012-06-25 ENCOUNTER — Emergency Department (HOSPITAL_COMMUNITY)
Admission: EM | Admit: 2012-06-25 | Discharge: 2012-06-25 | Disposition: A | Discharge status: Home / Self Care | Payer: Self-pay | Attending: Emergency Medicine | Admitting: Emergency Medicine

## 2012-06-25 ENCOUNTER — Encounter (HOSPITAL_COMMUNITY): Payer: Self-pay | Admitting: *Deleted

## 2012-06-25 DIAGNOSIS — J029 Acute pharyngitis, unspecified: Secondary | ICD-10-CM | POA: Insufficient documentation

## 2012-06-25 DIAGNOSIS — Z79899 Other long term (current) drug therapy: Secondary | ICD-10-CM | POA: Insufficient documentation

## 2012-06-25 DIAGNOSIS — Z87891 Personal history of nicotine dependence: Secondary | ICD-10-CM | POA: Insufficient documentation

## 2012-06-25 DIAGNOSIS — R6889 Other general symptoms and signs: Secondary | ICD-10-CM | POA: Insufficient documentation

## 2012-06-25 DIAGNOSIS — J3489 Other specified disorders of nose and nasal sinuses: Secondary | ICD-10-CM | POA: Insufficient documentation

## 2012-06-25 DIAGNOSIS — J069 Acute upper respiratory infection, unspecified: Secondary | ICD-10-CM | POA: Insufficient documentation

## 2012-06-25 MED ORDER — BENZONATATE 100 MG PO CAPS: 100.0000 mg | ORAL_CAPSULE | Freq: Three times a day (TID) | ORAL | Status: DC

## 2012-06-25 MED ORDER — BENZONATATE 100 MG PO CAPS
200.0000 mg | ORAL_CAPSULE | Freq: Once | ORAL | Status: AC
  Administered 2012-06-25: 200 mg via ORAL
  Filled 2012-06-25: qty 2

## 2012-06-25 MED ORDER — GUAIFENESIN 100 MG/5ML PO LIQD: 100.0000 mg | ORAL | Status: DC | PRN

## 2012-06-25 MED ORDER — GUAIFENESIN 100 MG/5ML PO SOLN
5.0000 mL | Freq: Once | ORAL | Status: AC
  Administered 2012-06-25: 100 mg via ORAL
  Filled 2012-06-25: qty 5

## 2012-06-25 NOTE — ED Provider Notes (Signed)
History     CSN: [id number]  Arrival date & time 06/25/12  0345   First MD Initiated Contact with Patient 06/25/12 0359      Chief Complaint  Patient presents with  . URI    (Consider location/radiation/quality/duration/timing/severity/associated sxs/prior treatment) HPI 66 year old female presents emergency room complaining of 3-4 days of cough, runny nose, nasal congestion, sneezing and sore throat. No fevers, no sick contacts. Cough is nonproductive. She is a nonsmoker. Patient has not tried any over-the-counter remedies. Patient presents tonight as the cough was keeping her up. She denies any shortness of breath. No chest pain, no leg swelling. History reviewed. No pertinent past medical history.  History reviewed. No pertinent past surgical history.  No family history on file.  History  Substance Use Topics  . Smoking status: Former Games developer  . Smokeless tobacco: Not on file  . Alcohol Use: No    OB History    Grav Para Term Preterm Abortions TAB SAB Ect Mult Living                  Review of Systems  See History of Present Illness; otherwise all other systems are reviewed and negative Allergies  Review of patient's allergies indicates no known allergies.  Home Medications   Current Outpatient Rx  Name  Route  Sig  Dispense  Refill  . ACYCLOVIR 200 MG PO CAPS   Oral   Take 200 mg by mouth daily.         Marland Kitchen CITALOPRAM HYDROBROMIDE 40 MG PO TABS   Oral   Take 40 mg by mouth daily.         Marland Kitchen ESTRADIOL 1 MG PO TABS   Oral   Take 0.5 mg by mouth daily.         . FUROSEMIDE 20 MG PO TABS   Oral   Take 20 mg by mouth daily.         Marland Kitchen PANTOPRAZOLE SODIUM 40 MG PO TBEC   Oral   Take 40 mg by mouth daily.         Marland Kitchen PRAMIPEXOLE DIHYDROCHLORIDE 0.5 MG PO TABS   Oral   Take 0.5 mg by mouth daily.         . ACETAMINOPHEN 500 MG PO TABS   Oral   Take 1,000 mg by mouth every 6 (six) hours as needed. headache         . BENZONATATE 100 MG PO  CAPS   Oral   Take 1 capsule (100 mg total) by mouth every 8 (eight) hours.   21 capsule   0   . GUAIFENESIN 100 MG/5ML PO LIQD   Oral   Take 5-10 mLs (100-200 mg total) by mouth every 4 (four) hours as needed for cough.   60 mL   0     BP 139/64  Pulse 81  Temp 98.4 F (36.9 C) (Oral)  Resp 20  SpO2 96%  Physical Exam  Nursing note and vitals reviewed. Constitutional: She is oriented to person, place, and time. She appears well-developed and well-nourished.  HENT:  Head: Normocephalic and atraumatic.  Right Ear: External ear normal.  Left Ear: External ear normal.  Nose: Nose normal.  Mouth/Throat: Oropharynx is clear and moist.  Eyes: Conjunctivae normal and EOM are normal. Pupils are equal, round, and reactive to light.  Neck: Normal range of motion. Neck supple. No JVD present. No tracheal deviation present. No thyromegaly present.  Cardiovascular: Normal rate, regular rhythm, normal  heart sounds and intact distal pulses.  Exam reveals no gallop and no friction rub.   No murmur heard. Pulmonary/Chest: Effort normal and breath sounds normal. No stridor. No respiratory distress. She has no wheezes. She has no rales. She exhibits no tenderness.       No cough during my exam  Abdominal: Soft. Bowel sounds are normal. She exhibits no distension and no mass. There is no tenderness. There is no rebound and no guarding.  Musculoskeletal: Normal range of motion. She exhibits no edema and no tenderness.  Lymphadenopathy:    She has no cervical adenopathy.  Neurological: She is alert and oriented to person, place, and time. She exhibits normal muscle tone. Coordination normal.  Skin: Skin is warm and dry. No rash noted. No erythema. No pallor.  Psychiatric: She has a normal mood and affect. Her behavior is normal. Judgment and thought content normal.    ED Course  Procedures (including critical care time)  Labs Reviewed - No data to display No results found.   1. Upper  respiratory infection       MDM  66 year old female with upper respiratory illness. Patient nontoxic well appearing. Will give Tessalon and Robitussin        Olivia Mackie, MD 06/25/12 [phone]

## 2012-06-25 NOTE — ED Notes (Signed)
Patient c/o cough, stuffy nose, sinus pressure x 4 days. Patient denies use of any medications at home. Dr Norlene Campbell at bedside.

## 2012-06-25 NOTE — ED Notes (Signed)
Pt complain of cough, congestion, sore throat and cough x 3-4 days; pt states "I can't sleep from the cough so I came to be seen"; denies fever or chills; denies difficulty breathing.

## 2012-06-25 NOTE — ED Notes (Signed)
Discharge instructions reviewed. Rx given x2. All questions answered.  

## 2012-07-24 ENCOUNTER — Encounter: Payer: Self-pay | Admitting: Internal Medicine

## 2012-07-31 ENCOUNTER — Telehealth: Payer: Self-pay | Admitting: *Deleted

## 2012-07-31 NOTE — Telephone Encounter (Signed)
Pt is scheduled for Dr. Juanda Chance on 08-17-12 (for a colonoscopy.)  She has previously seen Dr. Arlyce Dice in 2004 for an EGD.  I do not see any record where she asked to switch physicians, so I attempted to call her to change her appt.  No answer and No ID on machine.  Will call back

## 2012-07-31 NOTE — Telephone Encounter (Signed)
No answer still and no message left- No id on phone

## 2012-08-01 NOTE — Telephone Encounter (Signed)
Phone call to patient this morning. Pt requests to have a female physician for colonoscopy. Shared with her that previous endoscopy was done with Dr. Arlyce Dice and she replied that that was for her mouth and she preferred a female for a colonoscopy, she feels more comfortable. Discussed with Dr. Arlyce Dice patient transferring care to Dr. Juanda Chance to which he agreed. Will keep colonoscopy appointment with Dr. Juanda Chance as scheduled.

## 2012-08-01 NOTE — Telephone Encounter (Signed)
I will accept

## 2012-08-01 NOTE — Telephone Encounter (Signed)
Dr. Juanda Chance will you accept this patient transferring from Dr. Arlyce Dice to you?

## 2012-08-03 ENCOUNTER — Ambulatory Visit (AMBULATORY_SURGERY_CENTER): Payer: BC Managed Care – PPO | Admitting: *Deleted

## 2012-08-03 ENCOUNTER — Encounter: Payer: Self-pay | Admitting: Internal Medicine

## 2012-08-03 VITALS — Ht 60.0 in | Wt 171.0 lb

## 2012-08-03 DIAGNOSIS — Z1211 Encounter for screening for malignant neoplasm of colon: Secondary | ICD-10-CM

## 2012-08-03 MED ORDER — MOVIPREP 100 G PO SOLR: ORAL | Status: DC

## 2012-08-11 ENCOUNTER — Emergency Department (HOSPITAL_COMMUNITY)
Admission: EM | Admit: 2012-08-11 | Discharge: 2012-08-12 | Disposition: A | Discharge status: Home / Self Care | Payer: BC Managed Care – PPO | Attending: Emergency Medicine | Admitting: Emergency Medicine

## 2012-08-11 ENCOUNTER — Encounter (HOSPITAL_COMMUNITY): Payer: Self-pay | Admitting: *Deleted

## 2012-08-11 ENCOUNTER — Emergency Department (HOSPITAL_COMMUNITY): Payer: BC Managed Care – PPO

## 2012-08-11 DIAGNOSIS — Y9301 Activity, walking, marching and hiking: Secondary | ICD-10-CM | POA: Insufficient documentation

## 2012-08-11 DIAGNOSIS — S0120XA Unspecified open wound of nose, initial encounter: Secondary | ICD-10-CM | POA: Insufficient documentation

## 2012-08-11 DIAGNOSIS — Z79899 Other long term (current) drug therapy: Secondary | ICD-10-CM | POA: Insufficient documentation

## 2012-08-11 DIAGNOSIS — Z23 Encounter for immunization: Secondary | ICD-10-CM | POA: Insufficient documentation

## 2012-08-11 DIAGNOSIS — S022XXA Fracture of nasal bones, initial encounter for closed fracture: Secondary | ICD-10-CM | POA: Insufficient documentation

## 2012-08-11 DIAGNOSIS — W010XXA Fall on same level from slipping, tripping and stumbling without subsequent striking against object, initial encounter: Secondary | ICD-10-CM | POA: Insufficient documentation

## 2012-08-11 DIAGNOSIS — Z87891 Personal history of nicotine dependence: Secondary | ICD-10-CM | POA: Insufficient documentation

## 2012-08-11 DIAGNOSIS — K219 Gastro-esophageal reflux disease without esophagitis: Secondary | ICD-10-CM | POA: Insufficient documentation

## 2012-08-11 DIAGNOSIS — W1809XA Striking against other object with subsequent fall, initial encounter: Secondary | ICD-10-CM | POA: Insufficient documentation

## 2012-08-11 DIAGNOSIS — S0181XA Laceration without foreign body of other part of head, initial encounter: Secondary | ICD-10-CM

## 2012-08-11 DIAGNOSIS — Y929 Unspecified place or not applicable: Secondary | ICD-10-CM | POA: Insufficient documentation

## 2012-08-11 DIAGNOSIS — F3289 Other specified depressive episodes: Secondary | ICD-10-CM | POA: Insufficient documentation

## 2012-08-11 DIAGNOSIS — W19XXXA Unspecified fall, initial encounter: Secondary | ICD-10-CM

## 2012-08-11 DIAGNOSIS — G2581 Restless legs syndrome: Secondary | ICD-10-CM | POA: Insufficient documentation

## 2012-08-11 DIAGNOSIS — F329 Major depressive disorder, single episode, unspecified: Secondary | ICD-10-CM | POA: Insufficient documentation

## 2012-08-11 DIAGNOSIS — F411 Generalized anxiety disorder: Secondary | ICD-10-CM | POA: Insufficient documentation

## 2012-08-11 HISTORY — DX: Gastro-esophageal reflux disease without esophagitis: K21.9

## 2012-08-11 HISTORY — DX: Depression, unspecified: F32.A

## 2012-08-11 HISTORY — DX: Major depressive disorder, single episode, unspecified: F32.9

## 2012-08-11 MED ORDER — TETANUS-DIPHTH-ACELL PERTUSSIS 5-2.5-18.5 LF-MCG/0.5 IM SUSP
0.5000 mL | Freq: Once | INTRAMUSCULAR | Status: AC
  Administered 2012-08-11: 0.5 mL via INTRAMUSCULAR
  Filled 2012-08-11: qty 0.5
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
## 2012-08-11 NOTE — ED Notes (Signed)
Bed:WA16<BR> Expected date:<BR> Expected time:<BR> Means of arrival:<BR> Comments:<BR> EMS

## 2012-08-11 NOTE — ED Notes (Signed)
Patient ambulated to X-ray 

## 2012-08-12 MED ORDER — HYDROCODONE-ACETAMINOPHEN 5-325 MG PO TABS
1.0000 | ORAL_TABLET | Freq: Once | ORAL | Status: AC
  Administered 2012-08-12: 1 via ORAL
  Filled 2012-08-12: qty 1

## 2012-08-12 MED ORDER — HYDROCODONE-ACETAMINOPHEN 5-325 MG PO TABS: 1.0000 | ORAL_TABLET | Freq: Four times a day (QID) | ORAL | Status: DC | PRN

## 2012-08-12 NOTE — ED Notes (Signed)
NP at bedside with suture cart 
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
## 2012-08-12 NOTE — ED Provider Notes (Signed)
Medical screening examination/treatment/procedure(s) were performed by non-physician practitioner and as supervising physician I was immediately available for consultation/collaboration.  John-Adam Dawit Tankard, M.D.     John-Adam Karsynn Deweese, MD 08/12/12 0350 

## 2012-08-17 ENCOUNTER — Encounter: Payer: Self-pay | Admitting: Internal Medicine

## 2012-12-18 ENCOUNTER — Other Ambulatory Visit: Payer: Self-pay | Admitting: Otolaryngology

## 2012-12-27 ENCOUNTER — Ambulatory Visit (INDEPENDENT_AMBULATORY_CARE_PROVIDER_SITE_OTHER): Payer: BC Managed Care – PPO | Admitting: Physician Assistant

## 2012-12-27 VITALS — BP 102/62 | HR 80 | Temp 97.9°F | Resp 18 | Wt 166.0 lb

## 2012-12-27 DIAGNOSIS — Z111 Encounter for screening for respiratory tuberculosis: Secondary | ICD-10-CM

## 2012-12-27 NOTE — Progress Notes (Signed)
  Subjective:    Patient ID: Yvonne Stewart, female    DOB: 08-07-1945, 67 y.o.   MRN: [id number]  HPI   Yvonne Stewart is a 67 yr old female here requesting a TB skin test.  States she needs this for work.  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  States she has never had a positive TST in the past.  Thinks last skin test was about 1 yr ago.  Denies fever, chills, SOB, cough, weight loss, night sweats.   Review of Systems  Constitutional: Negative for fever, chills, diaphoresis and unexpected weight change.  HENT: Negative.   Respiratory: Negative for cough and shortness of breath.   Cardiovascular: Negative.   Gastrointestinal: Negative.   Musculoskeletal: Negative.   Skin: Negative.   Neurological: Negative.        Objective:   Physical Exam  Vitals reviewed. Constitutional: She is oriented to person, place, and time. She appears well-developed and well-nourished. No distress.  HENT:  Head: Normocephalic and atraumatic.  Eyes: Conjunctivae are normal. No scleral icterus.  Cardiovascular: Normal rate, regular rhythm and normal heart sounds.   Pulmonary/Chest: Effort normal and breath sounds normal. She has no wheezes. She has no rales.  Neurological: She is alert and oriented to person, place, and time.  Skin: Skin is warm and dry.  Psychiatric: She has a normal mood and affect. Her behavior is normal.        Assessment & Plan:  Screening for tuberculosis - Plan: TB Skin Test   Yvonne Stewart is a 67 yr old female here for tb skin test.  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  Pt to RTC in 48-72 hours for reading.

## 2012-12-27 NOTE — Progress Notes (Signed)
  Tuberculosis Risk Questionnaire  1. Were you born outside the [place] in one of the following parts of the world: [place], [place], [place], [place] or [place]?  No  2. Have you traveled outside the [place] and lived for more than one month in one of the following parts of the world: [place], [place], [place], [place] or [place]?  No  3. Do you have a compromised immune system such as from any of the following conditions:HIV/AIDS, organ or bone marrow transplantation, diabetes, immunosuppressive medicines (e.g. Prednisone, Remicaide), leukemia, lymphoma, cancer of the head or neck, gastrectomy or jejunal bypass, end-stage renal disease (on dialysis), or silicosis?  No   4. Have you ever or do you plan on working in: a residential care center, a health care facility, a jail or prison or homeless shelter?  Yes  5. Have you ever: injected illegal drugs, used crack cocaine, lived in a homeless shelter  or been in jail or prison? no    6. Have you ever been exposed to anyone with infectious tuberculosis?  No   Tuberculosis Symptom Questionnaire  Do you currently have any of the following symptoms?  1. Unexplained cough lasting more than 3 weeks? No  2. Unexplained fever lasting more than 3 weeks. No   3. Night Sweats (sweating that leaves the bedclothes and sheets wet)   No  4. Shortness of Breath No  5. Chest Pain No  6. Unintentional weight loss  No  7. Unexplained fatigue (very tired for no reason) No

## 2012-12-30 ENCOUNTER — Ambulatory Visit (INDEPENDENT_AMBULATORY_CARE_PROVIDER_SITE_OTHER): Payer: BC Managed Care – PPO

## 2012-12-30 DIAGNOSIS — Z111 Encounter for screening for respiratory tuberculosis: Secondary | ICD-10-CM

## 2012-12-30 LAB — TB SKIN TEST: Induration: 0 mm

## 2013-03-07 ENCOUNTER — Encounter (INDEPENDENT_AMBULATORY_CARE_PROVIDER_SITE_OTHER): Payer: Self-pay | Admitting: General Surgery

## 2013-03-07 ENCOUNTER — Ambulatory Visit (INDEPENDENT_AMBULATORY_CARE_PROVIDER_SITE_OTHER): Payer: Medicare Other | Admitting: General Surgery

## 2013-03-07 VITALS — BP 124/60 | HR 66 | Temp 98.0°F | Resp 18 | Ht 62.0 in | Wt 164.0 lb

## 2013-03-07 DIAGNOSIS — D179 Benign lipomatous neoplasm, unspecified: Secondary | ICD-10-CM

## 2013-03-07 NOTE — Progress Notes (Signed)
Subjective:     Patient ID: Yvonne Stewart, female   DOB: 06-Jul-1945, 67 y.o.   MRN: [id number]  HPI The patient is a 67 year old female self-referred for evaluation of a lipoma over her spinous process of C7. Patient states this is gotten bigger over the last 10 years. He states that its not causing pain.  Review of Systems  Constitutional: Negative.   HENT: Negative.   Respiratory: Negative.   Cardiovascular: Negative.   Gastrointestinal: Negative.   Endocrine: Negative.   Neurological: Negative.   All other systems reviewed and are negative.       Objective:   Physical Exam  Constitutional: She is oriented to person, place, and time. She appears well-developed and well-nourished.  HENT:  Head: Normocephalic and atraumatic.  Eyes: Conjunctivae and EOM are normal. Pupils are equal, round, and reactive to light.  Neck: Normal range of motion. Neck supple.  Cardiovascular: Normal rate, regular rhythm and normal heart sounds.   Pulmonary/Chest: Effort normal and breath sounds normal.  Abdominal: Soft. Bowel sounds are normal.  Musculoskeletal: Normal range of motion.  Neurological: She is alert and oriented to person, place, and time.       Assessment:     67 year old female with a concern of a lipoma.     Plan:     1. I believe the patient feels is normal subcutaneous fat over her C7 this process. There is no lipoma that I can palpate that is mobile.  2. Patient follow up as needed

## 2013-04-02 ENCOUNTER — Encounter (INDEPENDENT_AMBULATORY_CARE_PROVIDER_SITE_OTHER): Payer: Self-pay

## 2013-04-05 ENCOUNTER — Ambulatory Visit (INDEPENDENT_AMBULATORY_CARE_PROVIDER_SITE_OTHER): Payer: Medicare Other | Admitting: Internal Medicine

## 2013-04-05 VITALS — BP 120/62 | HR 66 | Temp 97.6°F | Resp 18 | Wt 164.0 lb

## 2013-04-05 DIAGNOSIS — Z111 Encounter for screening for respiratory tuberculosis: Secondary | ICD-10-CM

## 2013-04-05 NOTE — Patient Instructions (Signed)

## 2013-04-05 NOTE — Progress Notes (Signed)
  Subjective:    Patient ID: Yvonne Stewart, female    DOB: 10-Mar-1946, 67 y.o.   MRN: [id number]  HPI   Ms. Kerkhoff is a 67 yr old female here requesting a TB skin test.  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  She had a negative skin test in June 2014 but states she needs a two step test.  States her employer will accept it even though the second step will be placed over 3 months after the first.  She feels well today.  No symptoms.  Previous TSTs all negative.  See TB questionnaire.   Review of Systems  Constitutional: Negative for fever and chills.  HENT: Negative.   Respiratory: Negative.   Cardiovascular: Negative.   Gastrointestinal: Negative.   Musculoskeletal: Negative.   Skin: Negative.   Neurological: Negative.        Objective:   Physical Exam  Vitals reviewed. Constitutional: She is oriented to person, place, and time. She appears well-developed and well-nourished. No distress.  HENT:  Head: Normocephalic and atraumatic.  Eyes: Conjunctivae are normal. No scleral icterus.  Cardiovascular: Normal rate, regular rhythm and normal heart sounds.   Pulmonary/Chest: Effort normal and breath sounds normal.  Neurological: She is alert and oriented to person, place, and time.  Skin: Skin is warm and dry.  Psychiatric: She has a normal mood and affect. Her behavior is normal.       Assessment & Plan:  Screening for tuberculosis - Plan: TB Skin Test   Ms. Rainford is a 67 yr old female here for TB test.  She had a negative skin test here in June 2014.  Needs 2 step for work.  Discussed with her that for a 2 step test, the second ppd is typically placed 1-3 weeks after the first.  She states that her employer will accept a ppd today even though it is being placed over 3 months after the first.  PPD placed today.  Pt to return in 48-72 hours for read.    I have reviewed and agree with documentation. Robert P. Merla Riches, M.D.

## 2013-04-05 NOTE — Progress Notes (Signed)
  Tuberculosis Risk Questionnaire  1. No Were you born outside the [place] in one of the following parts of the world: [place], [place], [place], [place] or [place]?    2. No Have you traveled outside the [place] and lived for more than one month in one of the following parts of the world: [place], [place], [place], [place] or [place]?    3. No Do you have a compromised immune system such as from any of the following conditions:HIV/AIDS, organ or bone marrow transplantation, diabetes, immunosuppressive medicines (e.g. Prednisone, Remicaide), leukemia, lymphoma, cancer of the head or neck, gastrectomy or jejunal bypass, end-stage renal disease (on dialysis), or silicosis?     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    5. No Have you ever: injected illegal drugs, used crack cocaine, lived in a homeless shelter  or been in jail or prison?     6. No Have you ever been exposed to anyone with infectious tuberculosis?    Tuberculosis Symptom Questionnaire  Do you currently have any of the following symptoms?  1. No Unexplained cough lasting more than 3 weeks?   2. No Unexplained fever lasting more than 3 weeks.   3. No Night Sweats (sweating that leaves the bedclothes and sheets wet)     4. No Shortness of Breath   5. No Chest Pain   6. No Unintentional weight loss    7. No Unexplained fatigue (very tired for no reason)

## 2013-04-08 ENCOUNTER — Ambulatory Visit (INDEPENDENT_AMBULATORY_CARE_PROVIDER_SITE_OTHER): Payer: Medicare Other | Admitting: *Deleted

## 2013-04-08 DIAGNOSIS — Z111 Encounter for screening for respiratory tuberculosis: Secondary | ICD-10-CM

## 2013-04-08 NOTE — Progress Notes (Signed)
  Subjective:    Patient ID: Yvonne Stewart, female    DOB: 06-13-1946, 67 y.o.   MRN: [id number]  HPI  Pt here for a tb read, negative 0 induration  Review of Systems     Objective:   Physical Exam        Assessment & Plan:

## 2013-09-03 IMAGING — CR DG NASAL BONES 3+V
3 series · 3 of 3 positions shown · non-contrast
Comparison: CT head 08/10/2011

CLINICAL DATA: Pain across the bridge of the nose with laceration
after fall.

NASAL BONES - 3+ VIEW

[w waters pa]
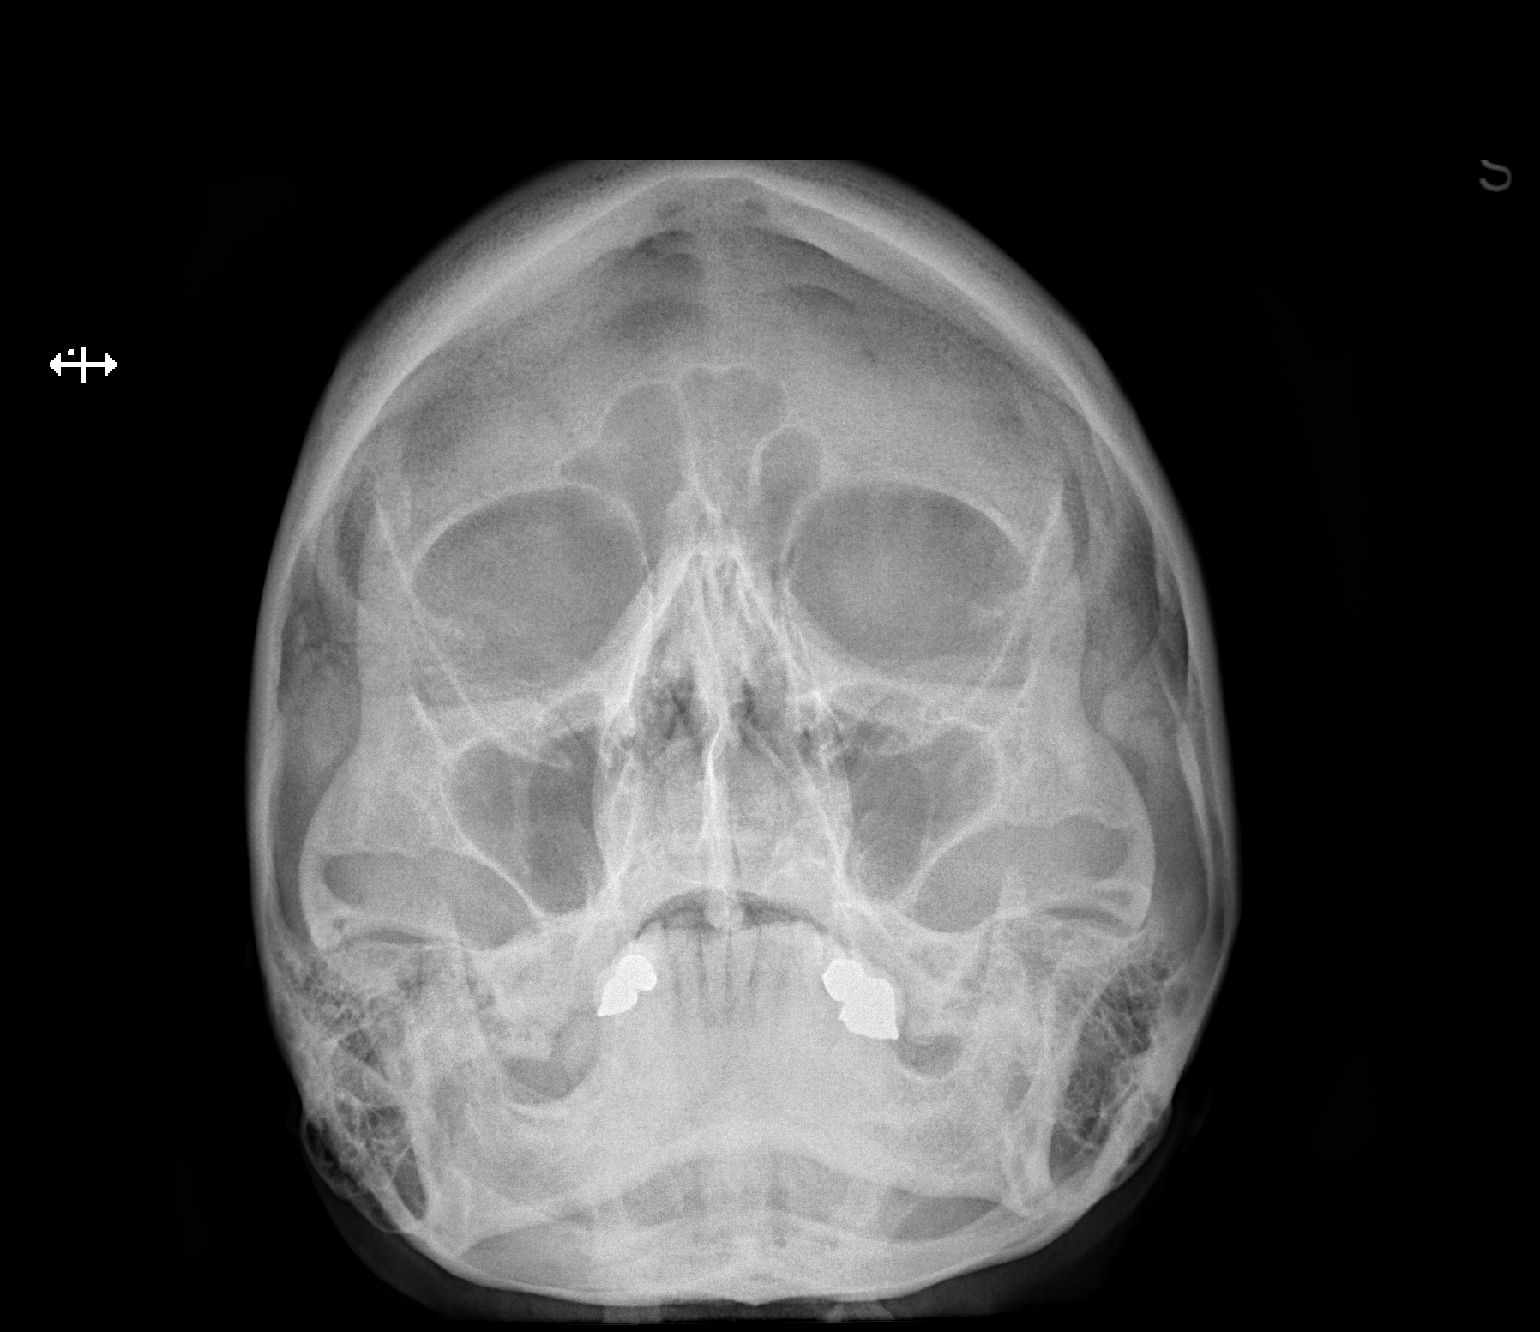

[w nasal bone lat (1 of 2)]
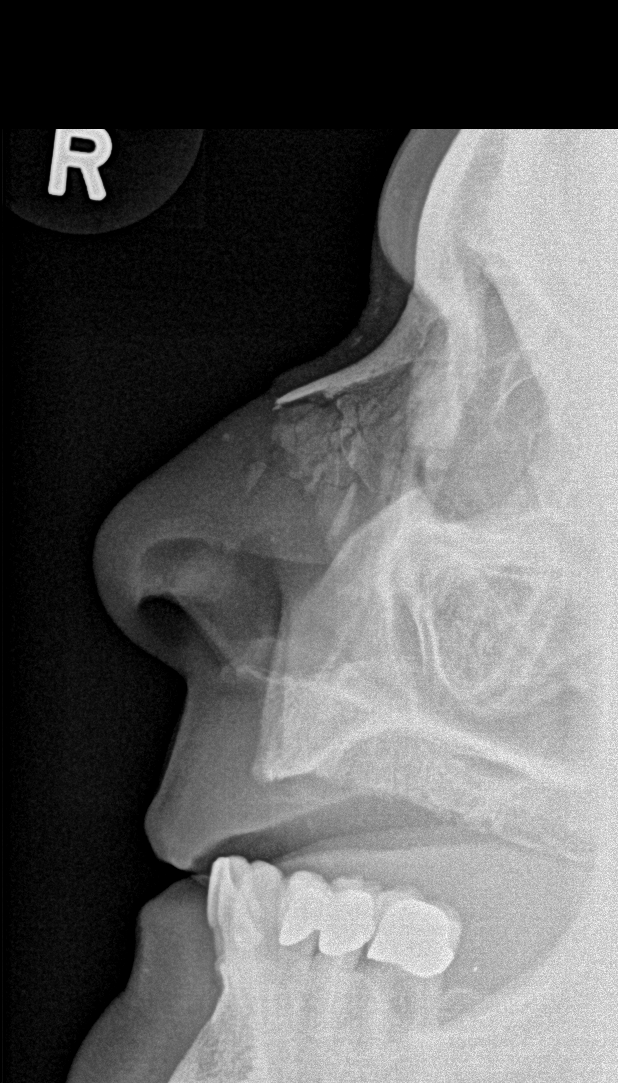

[w nasal bone lat (2 of 2)]
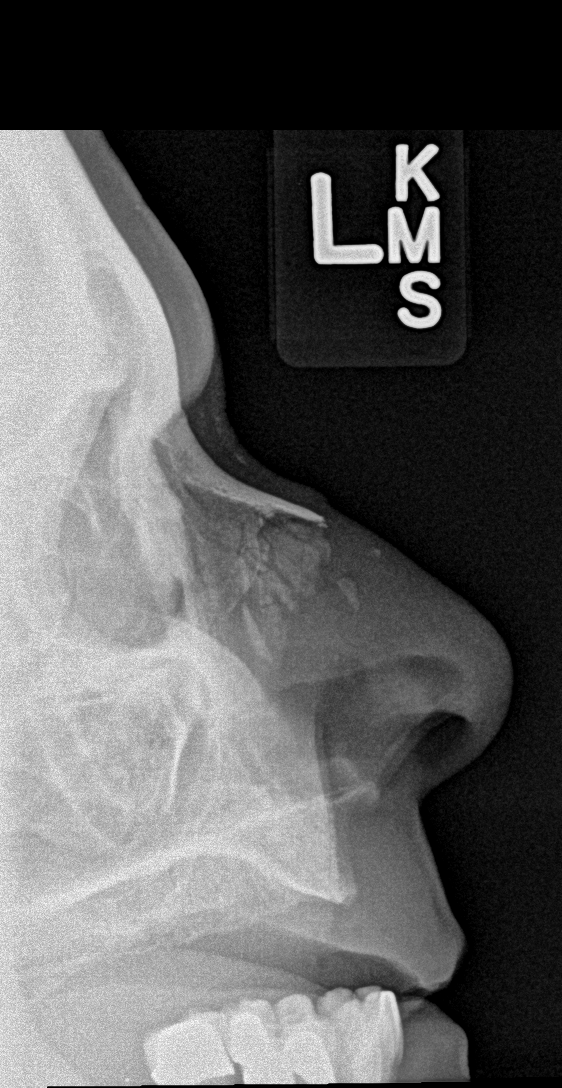

[3 of 3 positions shown; findings below may reference images not displayed]

FINDINGS: Comminuted nasal bone fractures are present with small
displaced fragments visualized.  There is acute angulation of the
nasal septum suggesting nasal septal fracture.  No opacification of
the paranasal sinuses.  Visualized orbital and facial bones appear
intact.
IMPRESSION: Comminuted displaced nasal bone fractures and probable nasal septal
fracture.

## 2013-10-09 ENCOUNTER — Other Ambulatory Visit: Payer: Self-pay | Admitting: Obstetrics and Gynecology

## 2014-03-31 ENCOUNTER — Emergency Department (HOSPITAL_COMMUNITY)
Admission: EM | Admit: 2014-03-31 | Discharge: 2014-03-31 | Disposition: A | Discharge status: Home / Self Care | Payer: Medicare Other | Attending: Emergency Medicine | Admitting: Emergency Medicine

## 2014-03-31 ENCOUNTER — Encounter (HOSPITAL_COMMUNITY): Payer: Self-pay | Admitting: Emergency Medicine

## 2014-03-31 DIAGNOSIS — G2581 Restless legs syndrome: Secondary | ICD-10-CM | POA: Insufficient documentation

## 2014-03-31 DIAGNOSIS — L02219 Cutaneous abscess of trunk, unspecified: Secondary | ICD-10-CM | POA: Insufficient documentation

## 2014-03-31 DIAGNOSIS — R21 Rash and other nonspecific skin eruption: Secondary | ICD-10-CM | POA: Diagnosis present

## 2014-03-31 DIAGNOSIS — Z87891 Personal history of nicotine dependence: Secondary | ICD-10-CM | POA: Diagnosis not present

## 2014-03-31 DIAGNOSIS — K219 Gastro-esophageal reflux disease without esophagitis: Secondary | ICD-10-CM | POA: Diagnosis not present

## 2014-03-31 DIAGNOSIS — F3289 Other specified depressive episodes: Secondary | ICD-10-CM | POA: Diagnosis not present

## 2014-03-31 DIAGNOSIS — F411 Generalized anxiety disorder: Secondary | ICD-10-CM | POA: Diagnosis not present

## 2014-03-31 DIAGNOSIS — B029 Zoster without complications: Secondary | ICD-10-CM | POA: Diagnosis not present

## 2014-03-31 DIAGNOSIS — L03313 Cellulitis of chest wall: Secondary | ICD-10-CM

## 2014-03-31 DIAGNOSIS — Z79899 Other long term (current) drug therapy: Secondary | ICD-10-CM | POA: Diagnosis not present

## 2014-03-31 DIAGNOSIS — F329 Major depressive disorder, single episode, unspecified: Secondary | ICD-10-CM | POA: Insufficient documentation

## 2014-03-31 DIAGNOSIS — L03319 Cellulitis of trunk, unspecified: Secondary | ICD-10-CM

## 2014-03-31 MED ORDER — CLINDAMYCIN HCL 300 MG PO CAPS: 300.0000 mg | ORAL_CAPSULE | Freq: Four times a day (QID) | ORAL | Status: DC

## 2014-03-31 MED ORDER — VALACYCLOVIR HCL 1 G PO TABS: 1000.0000 mg | ORAL_TABLET | Freq: Three times a day (TID) | ORAL | Status: AC

## 2014-03-31 NOTE — ED Provider Notes (Signed)
CSN: [id number]     Arrival date & time 03/31/14  1037 History   First MD Initiated Contact with Patient 03/31/14 1147     Chief Complaint  Patient presents with  . Rash     (Consider location/radiation/quality/duration/timing/severity/associated sxs/prior Treatment) HPI Comments: Patient presents with a rash. She's had a rash under her right breast for about a week. She saw her primary care physician who prescribed her nystatin ointment as well as Diflucan and Bactrim. She states that she's had some increased redness and itching around her right breast. She also states that 3 days ago she started having a rash to the left side of her face in her left neck. She also states that it's itchy. She denies any fevers. There is no nausea vomiting or headaches. She recently got a shingle shot about 6 months ago. She denies any eye irritation or vision changes.   Past Medical History  Diagnosis Date  . Restless leg syndrome   . Anxiety   . Depression   . GERD (gastroesophageal reflux disease)    Past Surgical History  Procedure Laterality Date  . No prior surgery     Family History  Problem Relation Age of Onset  . Colon cancer Neg Hx   . Stomach cancer Neg Hx   . Arthritis Mother    History  Substance Use Topics  . Smoking status: Former Smoker    Quit date: 08/04/1987  . Smokeless tobacco: Never Used  . Alcohol Use: No   OB History   Grav Para Term Preterm Abortions TAB SAB Ect Mult Living                 Review of Systems  Constitutional: Negative for fever, chills, diaphoresis and fatigue.  HENT: Negative for congestion, rhinorrhea and sneezing.   Eyes: Negative.   Respiratory: Negative for cough, chest tightness and shortness of breath.   Cardiovascular: Negative for chest pain and leg swelling.  Gastrointestinal: Negative for nausea, vomiting, abdominal pain, diarrhea and blood in stool.  Genitourinary: Negative for frequency, hematuria, flank pain and difficulty  urinating.  Musculoskeletal: Negative for arthralgias and back pain.  Skin: Positive for rash.  Neurological: Negative for dizziness, speech difficulty, weakness, numbness and headaches.      Allergies  Review of patient's allergies indicates no known allergies.  Home Medications   Prior to Admission medications   Medication Sig Start Date End Date Taking? Authorizing Provider  citalopram (CELEXA) 40 MG tablet Take 40 mg by mouth every evening.    Yes Historical Provider, MD  estradiol (ESTRACE) 0.5 MG tablet Take 0.5 mg by mouth every evening.   Yes Historical Provider, MD  furosemide (LASIX) 20 MG tablet Take 20 mg by mouth every evening.    Yes Historical Provider, MD  Multiple Vitamin (MULTIVITAMIN) tablet Take 1 tablet by mouth every evening.    Yes Historical Provider, MD  pantoprazole (PROTONIX) 40 MG tablet Take 40 mg by mouth every evening.    Yes Historical Provider, MD  pramipexole (MIRAPEX) 0.5 MG tablet Take 0.5 mg by mouth every evening.    Yes Historical Provider, MD  clindamycin (CLEOCIN) 300 MG capsule Take 1 capsule (300 mg total) by mouth 4 (four) times daily. X 7 days 03/31/14   Malvin Johns, MD  valACYclovir (VALTREX) 1000 MG tablet Take 1 tablet (1,000 mg total) by mouth 3 (three) times daily. 03/31/14 04/07/14  Malvin Johns, MD   BP 156/55  Pulse 67  Temp(Src) 97.6 F (36.4  C) (Oral)  Resp 16  SpO2 99% Physical Exam  Constitutional: She is oriented to person, place, and time. She appears well-developed and well-nourished.  HENT:  Head: Normocephalic and atraumatic.  Eyes: Pupils are equal, round, and reactive to light.  Neck: Normal range of motion. Neck supple.  Cardiovascular: Normal rate, regular rhythm and normal heart sounds.   Pulmonary/Chest: Effort normal and breath sounds normal. No respiratory distress. She has no wheezes. She has no rales. She exhibits no tenderness.  Abdominal: Soft. Bowel sounds are normal. There is no tenderness. There is no  rebound and no guarding.  Musculoskeletal: Normal range of motion. She exhibits no edema.  Lymphadenopathy:    She has no cervical adenopathy.  Neurological: She is alert and oriented to person, place, and time.  Skin: Skin is warm and dry. No rash noted.  Patient a large amount erythema in the crease under her right breast. There is also erythema of the right breast. There is serous sanguinous drainage from the area. There some small crusted vesicles under her right axilla. She has a vesicular rash to her left face. There's also some vesicles to the left posterior neck area. There is no erythema to the eye. There is no lesions in the left ear canal.  Psychiatric: She has a normal mood and affect.    ED Course  Procedures (including critical care time) Labs Review Labs Reviewed - No data to display  Imaging Review No results found.   EKG Interpretation None      MDM   Final diagnoses:  Shingles  Cellulitis of chest wall    Patient has a rash to the left face in the right breast. The right breast looks quite yeast infection with overlying cellulitis although the patient doesn't complain of pain to the area and she has some small vesicles at that crusted over in the right axilla. Advised her to continue using the nystatin cream and I will go ahead and change her antibiotic to clindamycin. The rest of the left side of her face looks consistent with shingles. She has no evident eye involvement or ear involvement. Given that started within the last 72 hours, I will go ahead and start her on Valtrex. She lives is that she's on low-dose acyclovir for prophylactic treatment of genital herpes. I advised her to stop this while she's taking the Valtrex. She has an appointment to follow up with a dermatologist regarding the rashes tomorrow for a recheck.    Malvin Johns, MD 03/31/14 [phone]

## 2014-03-31 NOTE — Discharge Instructions (Signed)

## 2014-03-31 NOTE — ED Notes (Signed)
Per pt, states rash/yeast infection under right breast-saw PCP and is on medication-states now has raished rash on left side of face and back of neck which started 4 days ago

## 2014-05-08 ENCOUNTER — Other Ambulatory Visit: Payer: Self-pay | Admitting: Obstetrics and Gynecology

## 2014-11-04 DIAGNOSIS — H4321 Crystalline deposits in vitreous body, right eye: Secondary | ICD-10-CM | POA: Insufficient documentation

## 2015-02-20 ENCOUNTER — Other Ambulatory Visit: Payer: Self-pay | Admitting: Obstetrics and Gynecology

## 2015-02-23 LAB — CYTOLOGY - PAP

## 2017-01-03 DIAGNOSIS — G609 Hereditary and idiopathic neuropathy, unspecified: Secondary | ICD-10-CM | POA: Insufficient documentation

## 2017-05-26 DIAGNOSIS — F32A Depression, unspecified: Secondary | ICD-10-CM | POA: Insufficient documentation

## 2019-05-02 DIAGNOSIS — I10 Essential (primary) hypertension: Secondary | ICD-10-CM | POA: Insufficient documentation

## 2019-05-02 DIAGNOSIS — F5104 Psychophysiologic insomnia: Secondary | ICD-10-CM | POA: Insufficient documentation

## 2020-12-24 DIAGNOSIS — N1831 Chronic kidney disease, stage 3a: Secondary | ICD-10-CM | POA: Insufficient documentation

## 2021-05-23 LAB — EXTERNAL GENERIC LAB PROCEDURE: COLOGUARD: NEGATIVE

## 2021-05-23 LAB — COLOGUARD: COLOGUARD: NEGATIVE

## 2021-07-08 ENCOUNTER — Ambulatory Visit (INDEPENDENT_AMBULATORY_CARE_PROVIDER_SITE_OTHER): Payer: No Typology Code available for payment source | Admitting: Podiatry

## 2021-07-08 ENCOUNTER — Ambulatory Visit: Payer: Self-pay | Admitting: Podiatry

## 2021-07-08 DIAGNOSIS — R42 Dizziness and giddiness: Secondary | ICD-10-CM | POA: Diagnosis not present

## 2021-07-08 DIAGNOSIS — Z91199 Patient's noncompliance with other medical treatment and regimen due to unspecified reason: Secondary | ICD-10-CM

## 2021-07-08 NOTE — Progress Notes (Signed)
No show

## 2021-07-09 ENCOUNTER — Encounter: Payer: Self-pay | Admitting: Physician Assistant

## 2021-07-12 DIAGNOSIS — R270 Ataxia, unspecified: Secondary | ICD-10-CM | POA: Diagnosis not present

## 2021-07-12 DIAGNOSIS — I10 Essential (primary) hypertension: Secondary | ICD-10-CM | POA: Diagnosis not present

## 2021-07-12 DIAGNOSIS — R930 Abnormal findings on diagnostic imaging of skull and head, not elsewhere classified: Secondary | ICD-10-CM | POA: Diagnosis not present

## 2021-07-12 DIAGNOSIS — G9389 Other specified disorders of brain: Secondary | ICD-10-CM | POA: Diagnosis not present

## 2021-07-12 DIAGNOSIS — R42 Dizziness and giddiness: Secondary | ICD-10-CM | POA: Diagnosis not present

## 2021-07-14 ENCOUNTER — Telehealth: Payer: Self-pay

## 2021-07-14 NOTE — Telephone Encounter (Signed)
error 

## 2021-07-15 ENCOUNTER — Other Ambulatory Visit: Payer: Self-pay

## 2021-07-15 ENCOUNTER — Ambulatory Visit (INDEPENDENT_AMBULATORY_CARE_PROVIDER_SITE_OTHER): Payer: No Typology Code available for payment source | Admitting: Physician Assistant

## 2021-07-15 ENCOUNTER — Ambulatory Visit (INDEPENDENT_AMBULATORY_CARE_PROVIDER_SITE_OTHER): Payer: No Typology Code available for payment source | Admitting: Podiatry

## 2021-07-15 ENCOUNTER — Encounter: Payer: Self-pay | Admitting: Podiatry

## 2021-07-15 VITALS — BP 156/79 | HR 68 | Temp 98.0°F | Ht 62.0 in | Wt 197.5 lb

## 2021-07-15 DIAGNOSIS — M79674 Pain in right toe(s): Secondary | ICD-10-CM

## 2021-07-15 DIAGNOSIS — G479 Sleep disorder, unspecified: Secondary | ICD-10-CM | POA: Diagnosis not present

## 2021-07-15 DIAGNOSIS — B351 Tinea unguium: Secondary | ICD-10-CM | POA: Diagnosis not present

## 2021-07-15 DIAGNOSIS — G609 Hereditary and idiopathic neuropathy, unspecified: Secondary | ICD-10-CM

## 2021-07-15 DIAGNOSIS — M79675 Pain in left toe(s): Secondary | ICD-10-CM | POA: Diagnosis not present

## 2021-07-15 DIAGNOSIS — R600 Localized edema: Secondary | ICD-10-CM

## 2021-07-15 DIAGNOSIS — N1831 Chronic kidney disease, stage 3a: Secondary | ICD-10-CM | POA: Diagnosis not present

## 2021-07-15 DIAGNOSIS — L84 Corns and callosities: Secondary | ICD-10-CM | POA: Diagnosis not present

## 2021-07-15 DIAGNOSIS — R42 Dizziness and giddiness: Secondary | ICD-10-CM

## 2021-07-15 DIAGNOSIS — R6889 Other general symptoms and signs: Secondary | ICD-10-CM

## 2021-07-15 NOTE — Progress Notes (Signed)
Subjective:    Patient ID: Yvonne Stewart, female    DOB: 04/03/1946, 76 y.o.   MRN: [id number]  No chief complaint on file.   HPI 76 y.o. patient presents today for new patient establishment with me.  Patient was previously established with [place]. Recently moved to [place]. Works as Programmer, applications.  Current Care Team: Dr. Girard Cooter - [facility] - Next f/up on 07/11/2021   Acute Concerns: Recent ED visit with [facility] on 07/12/2021 for dizziness and ataxia. Standard dizzy work-up negative. Vestibular/PT rehab was recommended.  -Not on BP medication  Still feeling dizzy today, all the time. Worse with sudden movements and turning her head. Feels uncomfortable and she's afraid she's going to fall. Having to use a cane now. Started in the last few months. Still driving, doesn't seem to feel bad with driving.   Chronic Concerns: -Pt stopped Estradiol about one month ago for hot flashes. -Still taking Mirapex for RLS - two 0.5 mg BID -Takes Lasix 20 mg for BLE -Celexa 40 mg for depression  -GERD protonix 40 mg  -Trazodone 100 mg at bedtime for sleep started in the last few months   Past Medical History:  Diagnosis Date   Anxiety    Depression    GERD (gastroesophageal reflux disease)    Restless leg syndrome     Past Surgical History:  Procedure Laterality Date   no prior surgery      Family History  Problem Relation Age of Onset   Colon cancer Neg Hx    Stomach cancer Neg Hx    Arthritis Mother     Social History   Tobacco Use   Smoking status: Former    Types: Cigarettes    Quit date: 08/04/1987    Years since quitting: 33.9   Smokeless tobacco: Never  Substance Use Topics   Alcohol use: No   Drug use: No     No Known Allergies  Review of Systems NEGATIVE UNLESS OTHERWISE INDICATED IN HPI      Objective:     BP (!) 156/79    Pulse 68    Temp 98 F (36.7 C)    Ht 5\' 2"  (1.575 m)    Wt 197 lb 8 oz (89.6 kg)    SpO2 97%    BMI  36.12 kg/m   Wt Readings from Last 3 Encounters:  07/15/21 197 lb 8 oz (89.6 kg)  04/05/13 164 lb (74.4 kg)  03/07/13 164 lb (74.4 kg)    BP Readings from Last 3 Encounters:  07/15/21 (!) 156/79  03/31/14 156/55  04/05/13 120/62     Physical Exam Vitals and nursing note reviewed.  Constitutional:      Appearance: Normal appearance. She is obese. She is not toxic-appearing.     Comments: Using a cane  HENT:     Head: Normocephalic and atraumatic.     Right Ear: External ear normal.     Left Ear: External ear normal.     Nose: Nose normal.     Mouth/Throat:     Mouth: Mucous membranes are moist.  Eyes:     Extraocular Movements: Extraocular movements intact.     Conjunctiva/sclera: Conjunctivae normal.     Pupils: Pupils are equal, round, and reactive to light.  Cardiovascular:     Rate and Rhythm: Normal rate and regular rhythm.     Pulses: Normal pulses.     Heart sounds: Normal heart sounds.  Pulmonary:  Effort: Pulmonary effort is normal.     Breath sounds: Normal breath sounds.  Musculoskeletal:        General: Normal range of motion.     Cervical back: Normal range of motion and neck supple.     Right lower leg: Edema present.     Left lower leg: Edema present.     Comments: 3+ swelling non-pitting BLE equal in both legs  Skin:    General: Skin is warm and dry.  Neurological:     General: No focal deficit present.     Mental Status: She is alert and oriented to person, place, and time.     Cranial Nerves: No cranial nerve deficit.     Motor: No weakness.     Coordination: Coordination normal.     Gait: Gait normal.  Psychiatric:        Mood and Affect: Mood normal.        Behavior: Behavior normal.        Thought Content: Thought content normal.        Judgment: Judgment normal.       Assessment & Plan:   Problem List Items Addressed This Visit   None Visit Diagnoses     Dizziness    -  Primary   Relevant Orders   ECHOCARDIOGRAM COMPLETE    Bilateral leg edema       Relevant Orders   ECHOCARDIOGRAM COMPLETE   Difficulty sleeping       Alteration in blood pressure          PLAN: -I personally reviewed report from [facility] emergency department on 07/12/2021 and I also reviewed her CT and MRI reports from that same day, which were both negative.  EKG at that time was in normal sinus rhythm with no ST or T wave changes.  She was discharged in stable condition and it was felt that she might need to consider PT for vestibular rehab or follow-up with neurology. -Further review of patient's medications today revealed that she just started on trazodone in the last few months, which is also when this dizzy feeling started.  I suspect this may be the cause of her symptoms.  Plan to discontinue the trazodone and she will follow-up with me in the next 2 to 3 weeks to let me know if her symptoms resolve. -Also encouraged her to drink at least 64 ounces water daily, change positions slowly.  She is going to limit her sodium intake and try to elevate her legs at the end of the day. -She has never had an echocardiogram done of her heart, which we discussed could be important given her lower extremity edema.  I sent order for this today. -I am also going to have her monitor her blood pressure at home and bring log to next appointment. -Return precautions discussed with the patient.  She is also aware of symptoms when she needs to go to the ED.   This note was prepared with assistance of Systems analyst. Occasional wrong-word or sound-a-like substitutions may have occurred due to the inherent limitations of voice recognition software.  Time Spent: 45 minutes of total time was spent on the date of the encounter performing the following actions: chart review prior to seeing the patient, obtaining history, performing a medically necessary exam, counseling on the treatment plan, placing orders, and documenting in our EHR.    Yvonne Stewart M  Yvonne Mclin, PA-C

## 2021-07-15 NOTE — Patient Instructions (Addendum)
Stop taking the Trazodone and see if your dizziness resolves. Be sure to drink at least 64 oz water daily. Limit sodium intake. Someone will call to schedule ultrasound of your heart.  Monitor your BP at home and bring log to next appointment.  Call sooner if any concerns. See you back in about 3 weeks.

## 2021-07-15 NOTE — Progress Notes (Signed)
Subjective:  Patient ID: Yvonne Stewart, female    DOB: 1946-04-21,   MRN: [id number]  Chief Complaint  Patient presents with   Nail Problem    Routine foot care / callus trim     76 y.o. female presents for concern of bilateral calluses and painful thickened and elongated toenails. Relates the left foot callus is very painful. She has had the calluses for years. Also requesting her toenails to be trimmed.  . Denies any other pedal complaints. Denies n/v/f/c.   Past Medical History:  Diagnosis Date   Anxiety    Depression    GERD (gastroesophageal reflux disease)    Restless leg syndrome     Objective:  Physical Exam: Vascular: DP/PT pulses 2/4 bilateral. CFT <3 seconds. Normal hair growth on digits. No edema.  Skin. No lacerations or abrasions bilateral feet. Hyperkeratotic lesion noted sub first met head on the left, pre-ulcerative. Nails 1-5 are thickened discolored and elongated with subungual debris.  Musculoskeletal: MMT 5/5 bilateral lower extremities in DF, PF, Inversion and Eversion. Deceased ROM in DF of ankle joint. Tender to hyperkeratotic lesion.  Neurological: Sensation intact to light touch. Decreased protective sensation.   Assessment:   1. Pre-ulcerative calluses   2. Idiopathic peripheral neuropathy   3. Pain due to onychomycosis of toenails of both feet   4. Chronic kidney disease, stage 3a ([place])      Plan:  Patient was evaluated and treated and all questions answered. -Discussed and educated patient on foot care, especially with  regards to the vascular, neurological and musculoskeletal systems.  -Discussed supportive shoes at all times and checking feet regularly.  -Mechanically debrided all nails 1-5 bilateral using sterile nail nipper and filed with dremel without incident  -Hyperkeratotic lesion debrided without incident. Padding dispensed to offload area.  -Answered all patient questions -Patient to return  in 3 months for at risk foot  care -Patient advised to call the office if any problems or questions arise in the meantime.   Lorenda Peck, DPM

## 2021-07-26 ENCOUNTER — Other Ambulatory Visit: Payer: Self-pay | Admitting: *Deleted

## 2021-07-26 ENCOUNTER — Telehealth: Payer: Self-pay

## 2021-07-26 NOTE — Telephone Encounter (Signed)
Patient Name: Yvonne Stewart Gender: Female DOB: 06-23-1946 Age: 76 Y 46 M 14 D Return Phone Number: [phone] (Primary) Address: City/ State/ Zip: [place]  [address] [facility] [place] [facility] [place] Night Provider Orma Flaming- MD Contact Type Call Who Is Calling Patient / Member / Family / Caregiver Call Type Triage / Clinical Relationship To Patient Self Return Phone Number [phone] (Primary) Chief Complaint Medication Question (non symptomatic) Reason for Call Symptomatic / Request for Yvonne Stewart states she needs help with her medication. Translation No Nurse Assessment Nurse: Jimmye Norman, RN, [place] Date/Time (Eastern Time): 07/24/2021 9:24:44 AM Confirm and document reason for call. If symptomatic, describe symptoms. ---Caller states that is working with the physician assistant Alyssa as to why she is unsteady/off balance. She came off of estradiol a few months ago and she is wondering if that could be causing the issue. Wants Alyssa to know about it. Does the patient have any new or worsening symptoms? ---No Please document clinical information provided and list any resource used. ---caller denies any new or worsening symptoms just wants to make sure that Yvonne Stewart is aware that she came off of that medication and is not sure that she does know. RN advised caller to call back if any new or worsening symptoms otherwise follow up with the office Monday regarding medication. Caller verbalized understanding. Disp. Time Eilene Ghazi Time) Disposition Final User 07/24/2021 9:11:22 AM Attempt made - no message left Artist Pais 07/24/2021 9:29:31 AM Clinical Call Yes Jimmye Norman, RN, Loree Fee

## 2021-07-26 NOTE — Telephone Encounter (Signed)
Rx Estradiol discontinue

## 2021-07-26 NOTE — Telephone Encounter (Signed)
Patient called today to make Alyssa aware of medication issues she is having since she was told to stop taking estradiol 2 months ago

## 2021-07-26 NOTE — Telephone Encounter (Signed)
See note

## 2021-07-29 DIAGNOSIS — L209 Atopic dermatitis, unspecified: Secondary | ICD-10-CM | POA: Diagnosis not present

## 2021-07-29 DIAGNOSIS — L219 Seborrheic dermatitis, unspecified: Secondary | ICD-10-CM | POA: Diagnosis not present

## 2021-07-29 DIAGNOSIS — L281 Prurigo nodularis: Secondary | ICD-10-CM | POA: Diagnosis not present

## 2021-07-30 DIAGNOSIS — R918 Other nonspecific abnormal finding of lung field: Secondary | ICD-10-CM | POA: Diagnosis not present

## 2021-08-02 ENCOUNTER — Ambulatory Visit: Payer: Medicare Other | Admitting: Physician Assistant

## 2021-08-05 ENCOUNTER — Encounter: Payer: Self-pay | Admitting: Physician Assistant

## 2021-08-05 ENCOUNTER — Ambulatory Visit: Payer: Medicare Other | Admitting: Family Medicine

## 2021-08-05 ENCOUNTER — Ambulatory Visit (INDEPENDENT_AMBULATORY_CARE_PROVIDER_SITE_OTHER): Payer: No Typology Code available for payment source | Admitting: Physician Assistant

## 2021-08-05 ENCOUNTER — Other Ambulatory Visit: Payer: Self-pay

## 2021-08-05 VITALS — BP 146/73 | HR 79 | Temp 97.6°F | Ht 62.0 in | Wt 196.5 lb

## 2021-08-05 DIAGNOSIS — R42 Dizziness and giddiness: Secondary | ICD-10-CM

## 2021-08-05 DIAGNOSIS — R918 Other nonspecific abnormal finding of lung field: Secondary | ICD-10-CM | POA: Diagnosis not present

## 2021-08-05 DIAGNOSIS — R413 Other amnesia: Secondary | ICD-10-CM

## 2021-08-05 NOTE — Progress Notes (Signed)
Subjective:    Patient ID: Yvonne Stewart, female    DOB: 1946-06-22, 76 y.o.   MRN: [id number]  Chief Complaint  Patient presents with   Follow-up    HPI Patient is in today for f/up from 07/15/2021.  148/67 144/75 149/79 153/75 138/76 136/76  Still having some dizziness and especially fears falling in the shower. Feels like something is wrong and like a foreigner in her own body. States she sometimes feels like she's going to fall backwards. Notes that she is definitely having memory changes as well and her recall isn't as good anymore.  She stopped taking Trazodone completely and says she is feeling a little bit better.  She is currently following up with [facility] for left upper lung mass, robotic bronchoscopy scheduled for 08/17/21. Pt says her cough resolved. She hasn't smoked in 30 years.    Past Medical History:  Diagnosis Date   Anxiety    Depression    GERD (gastroesophageal reflux disease)    Restless leg syndrome     Past Surgical History:  Procedure Laterality Date   no prior surgery      Family History  Problem Relation Age of Onset   Colon cancer Neg Hx    Stomach cancer Neg Hx    Arthritis Mother     Social History   Tobacco Use   Smoking status: Former    Types: Cigarettes    Quit date: 08/04/1987    Years since quitting: 34.0   Smokeless tobacco: Never  Substance Use Topics   Alcohol use: No   Drug use: No     No Known Allergies  Review of Systems NEGATIVE UNLESS OTHERWISE INDICATED IN HPI      Objective:     BP (!) 146/73    Pulse 79    Temp 97.6 F (36.4 C)    Ht 5\' 2"  (1.575 m)    Wt 196 lb 8 oz (89.1 kg)    SpO2 97%    BMI 35.94 kg/m   Wt Readings from Last 3 Encounters:  08/05/21 196 lb 8 oz (89.1 kg)  07/15/21 197 lb 8 oz (89.6 kg)  04/05/13 164 lb (74.4 kg)    BP Readings from Last 3 Encounters:  08/05/21 (!) 146/73  07/15/21 (!) 156/79  03/31/14 156/55     Physical Exam Vitals  and nursing note reviewed.  Constitutional:      Appearance: Normal appearance. She is normal weight. She is not toxic-appearing.  HENT:     Head: Normocephalic and atraumatic.     Right Ear: Tympanic membrane, ear canal and external ear normal.     Left Ear: Tympanic membrane, ear canal and external ear normal.     Nose: Nose normal.     Mouth/Throat:     Mouth: Mucous membranes are moist.  Eyes:     Extraocular Movements: Extraocular movements intact.     Conjunctiva/sclera: Conjunctivae normal.     Pupils: Pupils are equal, round, and reactive to light.  Cardiovascular:     Rate and Rhythm: Normal rate and regular rhythm.     Pulses: Normal pulses.     Heart sounds: Normal heart sounds.  Pulmonary:     Effort: Pulmonary effort is normal.     Breath sounds: Normal breath sounds.  Abdominal:     General: Abdomen is flat. Bowel sounds are normal.     Palpations: Abdomen is soft.  Musculoskeletal:  General: Normal range of motion.     Cervical back: Normal range of motion and neck supple.  Skin:    General: Skin is warm and dry.  Neurological:     General: No focal deficit present.     Mental Status: She is alert and oriented to person, place, and time.     Motor: No weakness.     Coordination: Romberg sign negative. Coordination normal. Finger-Nose-Finger Test normal.     Gait: Gait normal.  Psychiatric:        Mood and Affect: Mood normal.        Behavior: Behavior normal.        Thought Content: Thought content normal.        Judgment: Judgment normal.     Comments: Somewhat anxious appearing       Assessment & Plan:   Problem List Items Addressed This Visit   None Visit Diagnoses     Dizziness    -  Primary   Relevant Orders   Ambulatory referral to Neurology   Memory changes       Relevant Orders   Ambulatory referral to Neurology   Lung mass           1. Dizziness 2. Memory changes -No focal neurologic findings, but still not quite finding  etiology of her symptoms -Standard dizzy workup was negative on 07/12/21 at [facility] -Stopping trazodone only mildly improved her symptoms  -MMSE 29/30 today -Recommend f/up with neurology at this time for additional help -Low threshold for ED if acute symptoms  3. Lung mass -She is following with [facility] and will have bronchoscopy 08/17/21. She will let me know about her results.    This note was prepared with assistance of Systems analyst. Occasional wrong-word or sound-a-like substitutions may have occurred due to the inherent limitations of voice recognition software.  Time Spent: 31 minutes of total time was spent on the date of the encounter performing the following actions: chart review prior to seeing the patient, obtaining history, performing a medically necessary exam, counseling on the treatment plan, placing orders, and documenting in our EHR.    Anaeli Cornwall M Raiana Pharris, PA-C

## 2021-08-06 DIAGNOSIS — H52209 Unspecified astigmatism, unspecified eye: Secondary | ICD-10-CM | POA: Diagnosis not present

## 2021-08-06 DIAGNOSIS — H5213 Myopia, bilateral: Secondary | ICD-10-CM | POA: Diagnosis not present

## 2021-08-06 DIAGNOSIS — H524 Presbyopia: Secondary | ICD-10-CM | POA: Diagnosis not present

## 2021-08-06 DIAGNOSIS — H5203 Hypermetropia, bilateral: Secondary | ICD-10-CM | POA: Diagnosis not present

## 2021-08-09 ENCOUNTER — Telehealth: Payer: Self-pay

## 2021-08-09 ENCOUNTER — Other Ambulatory Visit: Payer: Self-pay | Admitting: *Deleted

## 2021-08-09 MED ORDER — CITALOPRAM HYDROBROMIDE 40 MG PO TABS: 40.0000 mg | ORAL_TABLET | Freq: Every evening | ORAL | 1 refills | Status: DC

## 2021-08-09 NOTE — Telephone Encounter (Signed)
Rx sent to the pharmacy.

## 2021-08-09 NOTE — Telephone Encounter (Signed)
..   Encourage patient to contact the pharmacy for refills or they can request refills through [place]:  08/05/2021  NEXT APPOINTMENT DATE: NA  MEDICATION: celexa   Is the patient out of medication?   PHARMACY: [facility] [address] in [place]   Let patient know to contact pharmacy at the end of the day to make sure medication is ready.  Please notify patient to allow 48-72 hours to process  CLINICAL FILLS OUT ALL BELOW:   LAST REFILL:  QTY:  REFILL DATE:    OTHER COMMENTS:    Okay for refill?  Please advise

## 2021-08-12 ENCOUNTER — Other Ambulatory Visit: Payer: Self-pay

## 2021-08-12 ENCOUNTER — Ambulatory Visit (HOSPITAL_COMMUNITY): Payer: No Typology Code available for payment source | Attending: Cardiology

## 2021-08-12 DIAGNOSIS — R42 Dizziness and giddiness: Secondary | ICD-10-CM | POA: Insufficient documentation

## 2021-08-12 DIAGNOSIS — R69 Illness, unspecified: Secondary | ICD-10-CM | POA: Diagnosis not present

## 2021-08-12 DIAGNOSIS — R600 Localized edema: Secondary | ICD-10-CM | POA: Diagnosis not present

## 2021-08-12 DIAGNOSIS — L299 Pruritus, unspecified: Secondary | ICD-10-CM | POA: Diagnosis not present

## 2021-08-12 DIAGNOSIS — L281 Prurigo nodularis: Secondary | ICD-10-CM | POA: Diagnosis not present

## 2021-08-12 DIAGNOSIS — L209 Atopic dermatitis, unspecified: Secondary | ICD-10-CM | POA: Diagnosis not present

## 2021-08-12 LAB — ECHOCARDIOGRAM COMPLETE: Area-P 1/2: 4.68 cm2

## 2021-08-13 DIAGNOSIS — R69 Illness, unspecified: Secondary | ICD-10-CM | POA: Diagnosis not present

## 2021-08-17 DIAGNOSIS — C3412 Malignant neoplasm of upper lobe, left bronchus or lung: Secondary | ICD-10-CM | POA: Diagnosis not present

## 2021-08-17 DIAGNOSIS — R918 Other nonspecific abnormal finding of lung field: Secondary | ICD-10-CM | POA: Diagnosis not present

## 2021-08-18 ENCOUNTER — Telehealth: Payer: Self-pay

## 2021-08-18 NOTE — Telephone Encounter (Signed)
Patient states she had a missed called from our office.    Wanted to know if there was a Regulatory affairs officer in our office.  I told her that we had a Dr. Billey Chang.  Patient states Dr. Jonni Sanger gave her a call.  Patient can be reached back at [phone].  Sending to both teams.    Please advised.

## 2021-08-18 NOTE — Telephone Encounter (Signed)
Patient is now calling back asking to speak with [facility].

## 2021-08-19 NOTE — Telephone Encounter (Signed)
Spoke with patient she stated nothing is needed.

## 2021-08-20 DIAGNOSIS — C3412 Malignant neoplasm of upper lobe, left bronchus or lung: Secondary | ICD-10-CM | POA: Diagnosis not present

## 2021-08-27 DIAGNOSIS — C3412 Malignant neoplasm of upper lobe, left bronchus or lung: Secondary | ICD-10-CM | POA: Diagnosis not present

## 2021-08-27 DIAGNOSIS — R911 Solitary pulmonary nodule: Secondary | ICD-10-CM | POA: Diagnosis not present

## 2021-08-27 DIAGNOSIS — R69 Illness, unspecified: Secondary | ICD-10-CM | POA: Diagnosis not present

## 2021-08-28 DIAGNOSIS — R69 Illness, unspecified: Secondary | ICD-10-CM | POA: Diagnosis not present

## 2021-08-31 DIAGNOSIS — R0989 Other specified symptoms and signs involving the circulatory and respiratory systems: Secondary | ICD-10-CM | POA: Diagnosis not present

## 2021-08-31 DIAGNOSIS — C3492 Malignant neoplasm of unspecified part of left bronchus or lung: Secondary | ICD-10-CM | POA: Diagnosis not present

## 2021-08-31 DIAGNOSIS — R69 Illness, unspecified: Secondary | ICD-10-CM | POA: Diagnosis not present

## 2021-09-02 DIAGNOSIS — Z87891 Personal history of nicotine dependence: Secondary | ICD-10-CM | POA: Diagnosis not present

## 2021-09-02 DIAGNOSIS — C3490 Malignant neoplasm of unspecified part of unspecified bronchus or lung: Secondary | ICD-10-CM | POA: Diagnosis not present

## 2021-09-03 DIAGNOSIS — C3412 Malignant neoplasm of upper lobe, left bronchus or lung: Secondary | ICD-10-CM | POA: Diagnosis not present

## 2021-09-03 DIAGNOSIS — C3492 Malignant neoplasm of unspecified part of left bronchus or lung: Secondary | ICD-10-CM | POA: Diagnosis not present

## 2021-09-03 DIAGNOSIS — Z87891 Personal history of nicotine dependence: Secondary | ICD-10-CM | POA: Diagnosis not present

## 2021-09-03 DIAGNOSIS — F17210 Nicotine dependence, cigarettes, uncomplicated: Secondary | ICD-10-CM | POA: Diagnosis not present

## 2021-09-07 ENCOUNTER — Other Ambulatory Visit: Payer: Self-pay

## 2021-09-07 ENCOUNTER — Telehealth: Payer: Self-pay | Admitting: Physician Assistant

## 2021-09-07 DIAGNOSIS — R918 Other nonspecific abnormal finding of lung field: Secondary | ICD-10-CM

## 2021-09-07 NOTE — Telephone Encounter (Signed)
Noted and agreed, thank you. 

## 2021-09-07 NOTE — Telephone Encounter (Signed)
Yvonne Stewart with [facility] states pt is having lung surgery and is needing a referral. Please call back asap at [phone]. If no answer, please call [phone]. ? ?Pt called in herself and scheduled an appt with Alyssa for 09/08/21. ?

## 2021-09-07 NOTE — Telephone Encounter (Signed)
See note

## 2021-09-08 ENCOUNTER — Ambulatory Visit: Payer: No Typology Code available for payment source | Admitting: Physician Assistant

## 2021-09-09 DIAGNOSIS — L281 Prurigo nodularis: Secondary | ICD-10-CM | POA: Diagnosis not present

## 2021-09-09 DIAGNOSIS — L209 Atopic dermatitis, unspecified: Secondary | ICD-10-CM | POA: Diagnosis not present

## 2021-09-16 ENCOUNTER — Ambulatory Visit: Payer: Medicare Other | Admitting: Podiatry

## 2021-09-16 DIAGNOSIS — Z4682 Encounter for fitting and adjustment of non-vascular catheter: Secondary | ICD-10-CM | POA: Diagnosis not present

## 2021-09-16 DIAGNOSIS — F32A Depression, unspecified: Secondary | ICD-10-CM | POA: Diagnosis not present

## 2021-09-16 DIAGNOSIS — K219 Gastro-esophageal reflux disease without esophagitis: Secondary | ICD-10-CM | POA: Diagnosis not present

## 2021-09-16 DIAGNOSIS — Z981 Arthrodesis status: Secondary | ICD-10-CM | POA: Diagnosis not present

## 2021-09-16 DIAGNOSIS — I129 Hypertensive chronic kidney disease with stage 1 through stage 4 chronic kidney disease, or unspecified chronic kidney disease: Secondary | ICD-10-CM | POA: Diagnosis not present

## 2021-09-16 DIAGNOSIS — G2581 Restless legs syndrome: Secondary | ICD-10-CM | POA: Diagnosis not present

## 2021-09-16 DIAGNOSIS — N1831 Chronic kidney disease, stage 3a: Secondary | ICD-10-CM | POA: Diagnosis not present

## 2021-09-16 DIAGNOSIS — Z87891 Personal history of nicotine dependence: Secondary | ICD-10-CM | POA: Diagnosis not present

## 2021-09-16 DIAGNOSIS — C771 Secondary and unspecified malignant neoplasm of intrathoracic lymph nodes: Secondary | ICD-10-CM | POA: Diagnosis not present

## 2021-09-16 DIAGNOSIS — C3492 Malignant neoplasm of unspecified part of left bronchus or lung: Secondary | ICD-10-CM | POA: Diagnosis not present

## 2021-09-16 DIAGNOSIS — J9382 Other air leak: Secondary | ICD-10-CM | POA: Diagnosis not present

## 2021-09-16 DIAGNOSIS — F419 Anxiety disorder, unspecified: Secondary | ICD-10-CM | POA: Diagnosis not present

## 2021-09-16 DIAGNOSIS — C3412 Malignant neoplasm of upper lobe, left bronchus or lung: Secondary | ICD-10-CM | POA: Diagnosis not present

## 2021-09-16 DIAGNOSIS — J939 Pneumothorax, unspecified: Secondary | ICD-10-CM | POA: Diagnosis not present

## 2021-09-16 DIAGNOSIS — E785 Hyperlipidemia, unspecified: Secondary | ICD-10-CM | POA: Diagnosis not present

## 2021-09-17 DIAGNOSIS — J95811 Postprocedural pneumothorax: Secondary | ICD-10-CM | POA: Diagnosis not present

## 2021-09-19 DIAGNOSIS — J939 Pneumothorax, unspecified: Secondary | ICD-10-CM | POA: Diagnosis not present

## 2021-09-19 DIAGNOSIS — Z4682 Encounter for fitting and adjustment of non-vascular catheter: Secondary | ICD-10-CM | POA: Diagnosis not present

## 2021-09-20 ENCOUNTER — Telehealth: Payer: Self-pay | Admitting: Physician Assistant

## 2021-09-20 NOTE — Telephone Encounter (Signed)
Spoke with patient she stated she going to get a new pcp due to she moved to [place].   ?

## 2021-09-23 DIAGNOSIS — Z4682 Encounter for fitting and adjustment of non-vascular catheter: Secondary | ICD-10-CM | POA: Diagnosis not present

## 2021-09-23 DIAGNOSIS — R918 Other nonspecific abnormal finding of lung field: Secondary | ICD-10-CM | POA: Diagnosis not present

## 2021-09-24 DIAGNOSIS — Z4682 Encounter for fitting and adjustment of non-vascular catheter: Secondary | ICD-10-CM | POA: Diagnosis not present

## 2021-09-25 DIAGNOSIS — F419 Anxiety disorder, unspecified: Secondary | ICD-10-CM | POA: Diagnosis not present

## 2021-09-25 DIAGNOSIS — I129 Hypertensive chronic kidney disease with stage 1 through stage 4 chronic kidney disease, or unspecified chronic kidney disease: Secondary | ICD-10-CM | POA: Diagnosis not present

## 2021-09-25 DIAGNOSIS — G4733 Obstructive sleep apnea (adult) (pediatric): Secondary | ICD-10-CM | POA: Diagnosis not present

## 2021-09-25 DIAGNOSIS — C3492 Malignant neoplasm of unspecified part of left bronchus or lung: Secondary | ICD-10-CM | POA: Diagnosis not present

## 2021-09-25 DIAGNOSIS — M48062 Spinal stenosis, lumbar region with neurogenic claudication: Secondary | ICD-10-CM | POA: Diagnosis not present

## 2021-09-25 DIAGNOSIS — N183 Chronic kidney disease, stage 3 unspecified: Secondary | ICD-10-CM | POA: Diagnosis not present

## 2021-09-25 DIAGNOSIS — M4316 Spondylolisthesis, lumbar region: Secondary | ICD-10-CM | POA: Diagnosis not present

## 2021-09-25 DIAGNOSIS — G2581 Restless legs syndrome: Secondary | ICD-10-CM | POA: Diagnosis not present

## 2021-09-25 DIAGNOSIS — F32A Depression, unspecified: Secondary | ICD-10-CM | POA: Diagnosis not present

## 2021-09-25 DIAGNOSIS — J95811 Postprocedural pneumothorax: Secondary | ICD-10-CM | POA: Diagnosis not present

## 2021-09-25 DIAGNOSIS — Z87891 Personal history of nicotine dependence: Secondary | ICD-10-CM | POA: Diagnosis not present

## 2021-09-29 DIAGNOSIS — K120 Recurrent oral aphthae: Secondary | ICD-10-CM | POA: Diagnosis not present

## 2021-10-01 DIAGNOSIS — Z902 Acquired absence of lung [part of]: Secondary | ICD-10-CM | POA: Diagnosis not present

## 2021-10-01 DIAGNOSIS — J948 Other specified pleural conditions: Secondary | ICD-10-CM | POA: Diagnosis not present

## 2021-10-01 DIAGNOSIS — C3412 Malignant neoplasm of upper lobe, left bronchus or lung: Secondary | ICD-10-CM | POA: Diagnosis not present

## 2021-10-01 DIAGNOSIS — Z9889 Other specified postprocedural states: Secondary | ICD-10-CM | POA: Diagnosis not present

## 2021-10-01 DIAGNOSIS — C3492 Malignant neoplasm of unspecified part of left bronchus or lung: Secondary | ICD-10-CM | POA: Diagnosis not present

## 2021-10-01 DIAGNOSIS — Z79899 Other long term (current) drug therapy: Secondary | ICD-10-CM | POA: Diagnosis not present

## 2021-10-02 DIAGNOSIS — Y998 Other external cause status: Secondary | ICD-10-CM | POA: Diagnosis not present

## 2021-10-02 DIAGNOSIS — X58XXXA Exposure to other specified factors, initial encounter: Secondary | ICD-10-CM | POA: Diagnosis not present

## 2021-10-02 DIAGNOSIS — R58 Hemorrhage, not elsewhere classified: Secondary | ICD-10-CM | POA: Diagnosis not present

## 2021-10-02 DIAGNOSIS — I10 Essential (primary) hypertension: Secondary | ICD-10-CM | POA: Diagnosis not present

## 2021-10-02 DIAGNOSIS — S81801A Unspecified open wound, right lower leg, initial encounter: Secondary | ICD-10-CM | POA: Diagnosis not present

## 2021-10-08 DIAGNOSIS — Z87891 Personal history of nicotine dependence: Secondary | ICD-10-CM | POA: Diagnosis not present

## 2021-10-08 DIAGNOSIS — C3412 Malignant neoplasm of upper lobe, left bronchus or lung: Secondary | ICD-10-CM | POA: Diagnosis not present

## 2021-10-08 DIAGNOSIS — G609 Hereditary and idiopathic neuropathy, unspecified: Secondary | ICD-10-CM | POA: Diagnosis not present

## 2021-10-08 DIAGNOSIS — G2581 Restless legs syndrome: Secondary | ICD-10-CM | POA: Diagnosis not present

## 2021-10-08 DIAGNOSIS — C3492 Malignant neoplasm of unspecified part of left bronchus or lung: Secondary | ICD-10-CM | POA: Diagnosis not present

## 2021-10-08 DIAGNOSIS — M4316 Spondylolisthesis, lumbar region: Secondary | ICD-10-CM | POA: Diagnosis not present

## 2021-10-08 DIAGNOSIS — M48062 Spinal stenosis, lumbar region with neurogenic claudication: Secondary | ICD-10-CM | POA: Diagnosis not present

## 2021-10-08 DIAGNOSIS — K219 Gastro-esophageal reflux disease without esophagitis: Secondary | ICD-10-CM | POA: Diagnosis not present

## 2021-10-08 DIAGNOSIS — G8929 Other chronic pain: Secondary | ICD-10-CM | POA: Diagnosis not present

## 2021-10-08 DIAGNOSIS — F5104 Psychophysiologic insomnia: Secondary | ICD-10-CM | POA: Diagnosis not present

## 2021-10-08 DIAGNOSIS — R52 Pain, unspecified: Secondary | ICD-10-CM | POA: Diagnosis not present

## 2021-10-08 DIAGNOSIS — M545 Low back pain, unspecified: Secondary | ICD-10-CM | POA: Diagnosis not present

## 2021-10-08 DIAGNOSIS — Z9889 Other specified postprocedural states: Secondary | ICD-10-CM | POA: Diagnosis not present

## 2021-10-08 DIAGNOSIS — Z79899 Other long term (current) drug therapy: Secondary | ICD-10-CM | POA: Diagnosis not present

## 2021-10-08 DIAGNOSIS — Z4889 Encounter for other specified surgical aftercare: Secondary | ICD-10-CM | POA: Diagnosis not present

## 2021-10-08 DIAGNOSIS — F32A Depression, unspecified: Secondary | ICD-10-CM | POA: Diagnosis not present

## 2021-10-08 DIAGNOSIS — J948 Other specified pleural conditions: Secondary | ICD-10-CM | POA: Diagnosis not present

## 2021-10-08 DIAGNOSIS — R0602 Shortness of breath: Secondary | ICD-10-CM | POA: Diagnosis not present

## 2021-10-08 DIAGNOSIS — Z483 Aftercare following surgery for neoplasm: Secondary | ICD-10-CM | POA: Diagnosis not present

## 2021-10-08 DIAGNOSIS — J90 Pleural effusion, not elsewhere classified: Secondary | ICD-10-CM | POA: Diagnosis not present

## 2021-10-08 DIAGNOSIS — M150 Primary generalized (osteo)arthritis: Secondary | ICD-10-CM | POA: Diagnosis not present

## 2021-10-08 DIAGNOSIS — H409 Unspecified glaucoma: Secondary | ICD-10-CM | POA: Diagnosis not present

## 2021-10-12 DIAGNOSIS — Z87891 Personal history of nicotine dependence: Secondary | ICD-10-CM | POA: Diagnosis not present

## 2021-10-12 DIAGNOSIS — C3412 Malignant neoplasm of upper lobe, left bronchus or lung: Secondary | ICD-10-CM | POA: Diagnosis not present

## 2021-10-12 DIAGNOSIS — C3492 Malignant neoplasm of unspecified part of left bronchus or lung: Secondary | ICD-10-CM | POA: Diagnosis not present

## 2021-10-12 DIAGNOSIS — K5900 Constipation, unspecified: Secondary | ICD-10-CM | POA: Diagnosis not present

## 2021-10-12 DIAGNOSIS — K625 Hemorrhage of anus and rectum: Secondary | ICD-10-CM | POA: Diagnosis not present

## 2021-10-12 DIAGNOSIS — Z720 Tobacco use: Secondary | ICD-10-CM | POA: Diagnosis not present

## 2021-10-12 DIAGNOSIS — Z902 Acquired absence of lung [part of]: Secondary | ICD-10-CM | POA: Diagnosis not present

## 2021-10-26 DIAGNOSIS — M2550 Pain in unspecified joint: Secondary | ICD-10-CM | POA: Diagnosis not present

## 2021-10-26 DIAGNOSIS — Z87891 Personal history of nicotine dependence: Secondary | ICD-10-CM | POA: Diagnosis not present

## 2021-10-26 DIAGNOSIS — C3412 Malignant neoplasm of upper lobe, left bronchus or lung: Secondary | ICD-10-CM | POA: Diagnosis not present

## 2021-10-26 DIAGNOSIS — R252 Cramp and spasm: Secondary | ICD-10-CM | POA: Diagnosis not present

## 2021-10-26 DIAGNOSIS — C3492 Malignant neoplasm of unspecified part of left bronchus or lung: Secondary | ICD-10-CM | POA: Diagnosis not present

## 2021-10-26 DIAGNOSIS — Z720 Tobacco use: Secondary | ICD-10-CM | POA: Diagnosis not present

## 2021-10-26 DIAGNOSIS — R0602 Shortness of breath: Secondary | ICD-10-CM | POA: Diagnosis not present

## 2021-10-26 DIAGNOSIS — R42 Dizziness and giddiness: Secondary | ICD-10-CM | POA: Diagnosis not present

## 2021-10-29 DIAGNOSIS — Z006 Encounter for examination for normal comparison and control in clinical research program: Secondary | ICD-10-CM | POA: Diagnosis not present

## 2021-10-29 DIAGNOSIS — C3432 Malignant neoplasm of lower lobe, left bronchus or lung: Secondary | ICD-10-CM | POA: Diagnosis not present

## 2021-10-29 DIAGNOSIS — R590 Localized enlarged lymph nodes: Secondary | ICD-10-CM | POA: Diagnosis not present

## 2021-10-29 DIAGNOSIS — R69 Illness, unspecified: Secondary | ICD-10-CM | POA: Diagnosis not present

## 2021-11-02 DIAGNOSIS — C3412 Malignant neoplasm of upper lobe, left bronchus or lung: Secondary | ICD-10-CM | POA: Diagnosis not present

## 2021-11-02 DIAGNOSIS — C771 Secondary and unspecified malignant neoplasm of intrathoracic lymph nodes: Secondary | ICD-10-CM | POA: Diagnosis not present

## 2021-11-02 DIAGNOSIS — Z79630 Long term (current) use of alkylating agent: Secondary | ICD-10-CM | POA: Diagnosis not present

## 2021-11-02 DIAGNOSIS — Z720 Tobacco use: Secondary | ICD-10-CM | POA: Diagnosis not present

## 2021-11-02 DIAGNOSIS — C3492 Malignant neoplasm of unspecified part of left bronchus or lung: Secondary | ICD-10-CM | POA: Diagnosis not present

## 2021-11-02 DIAGNOSIS — Z79899 Other long term (current) drug therapy: Secondary | ICD-10-CM | POA: Diagnosis not present

## 2021-11-02 DIAGNOSIS — Z006 Encounter for examination for normal comparison and control in clinical research program: Secondary | ICD-10-CM | POA: Diagnosis not present

## 2021-11-03 DIAGNOSIS — G609 Hereditary and idiopathic neuropathy, unspecified: Secondary | ICD-10-CM | POA: Diagnosis not present

## 2021-11-03 DIAGNOSIS — K219 Gastro-esophageal reflux disease without esophagitis: Secondary | ICD-10-CM | POA: Diagnosis not present

## 2021-11-03 DIAGNOSIS — M4316 Spondylolisthesis, lumbar region: Secondary | ICD-10-CM | POA: Diagnosis not present

## 2021-11-03 DIAGNOSIS — M545 Low back pain, unspecified: Secondary | ICD-10-CM | POA: Diagnosis not present

## 2021-11-03 DIAGNOSIS — G2581 Restless legs syndrome: Secondary | ICD-10-CM | POA: Diagnosis not present

## 2021-11-03 DIAGNOSIS — G8929 Other chronic pain: Secondary | ICD-10-CM | POA: Diagnosis not present

## 2021-11-03 DIAGNOSIS — F5104 Psychophysiologic insomnia: Secondary | ICD-10-CM | POA: Diagnosis not present

## 2021-11-03 DIAGNOSIS — M150 Primary generalized (osteo)arthritis: Secondary | ICD-10-CM | POA: Diagnosis not present

## 2021-11-03 DIAGNOSIS — H409 Unspecified glaucoma: Secondary | ICD-10-CM | POA: Diagnosis not present

## 2021-11-03 DIAGNOSIS — Z483 Aftercare following surgery for neoplasm: Secondary | ICD-10-CM | POA: Diagnosis not present

## 2021-11-03 DIAGNOSIS — C3492 Malignant neoplasm of unspecified part of left bronchus or lung: Secondary | ICD-10-CM | POA: Diagnosis not present

## 2021-11-03 DIAGNOSIS — M48062 Spinal stenosis, lumbar region with neurogenic claudication: Secondary | ICD-10-CM | POA: Diagnosis not present

## 2021-11-03 DIAGNOSIS — F32A Depression, unspecified: Secondary | ICD-10-CM | POA: Diagnosis not present

## 2021-11-08 DIAGNOSIS — C3412 Malignant neoplasm of upper lobe, left bronchus or lung: Secondary | ICD-10-CM | POA: Diagnosis not present

## 2021-11-08 DIAGNOSIS — Z08 Encounter for follow-up examination after completed treatment for malignant neoplasm: Secondary | ICD-10-CM | POA: Diagnosis not present

## 2021-11-08 DIAGNOSIS — C771 Secondary and unspecified malignant neoplasm of intrathoracic lymph nodes: Secondary | ICD-10-CM | POA: Diagnosis not present

## 2021-11-08 DIAGNOSIS — E860 Dehydration: Secondary | ICD-10-CM | POA: Diagnosis not present

## 2021-11-08 DIAGNOSIS — Z85118 Personal history of other malignant neoplasm of bronchus and lung: Secondary | ICD-10-CM | POA: Diagnosis not present

## 2021-11-08 DIAGNOSIS — R42 Dizziness and giddiness: Secondary | ICD-10-CM | POA: Diagnosis not present

## 2021-11-08 DIAGNOSIS — E785 Hyperlipidemia, unspecified: Secondary | ICD-10-CM | POA: Diagnosis not present

## 2021-11-08 DIAGNOSIS — R634 Abnormal weight loss: Secondary | ICD-10-CM | POA: Diagnosis not present

## 2021-11-10 DIAGNOSIS — E559 Vitamin D deficiency, unspecified: Secondary | ICD-10-CM | POA: Diagnosis not present

## 2021-11-10 DIAGNOSIS — I129 Hypertensive chronic kidney disease with stage 1 through stage 4 chronic kidney disease, or unspecified chronic kidney disease: Secondary | ICD-10-CM | POA: Diagnosis not present

## 2021-11-10 DIAGNOSIS — K219 Gastro-esophageal reflux disease without esophagitis: Secondary | ICD-10-CM | POA: Diagnosis not present

## 2021-11-10 DIAGNOSIS — F330 Major depressive disorder, recurrent, mild: Secondary | ICD-10-CM | POA: Diagnosis not present

## 2021-11-10 DIAGNOSIS — N1831 Chronic kidney disease, stage 3a: Secondary | ICD-10-CM | POA: Diagnosis not present

## 2021-11-10 DIAGNOSIS — R2689 Other abnormalities of gait and mobility: Secondary | ICD-10-CM | POA: Diagnosis not present

## 2021-11-10 DIAGNOSIS — F419 Anxiety disorder, unspecified: Secondary | ICD-10-CM | POA: Diagnosis not present

## 2021-11-10 DIAGNOSIS — R42 Dizziness and giddiness: Secondary | ICD-10-CM | POA: Diagnosis not present

## 2021-11-10 DIAGNOSIS — G2581 Restless legs syndrome: Secondary | ICD-10-CM | POA: Diagnosis not present

## 2021-11-10 DIAGNOSIS — E785 Hyperlipidemia, unspecified: Secondary | ICD-10-CM | POA: Diagnosis not present

## 2021-11-11 DIAGNOSIS — M722 Plantar fascial fibromatosis: Secondary | ICD-10-CM | POA: Diagnosis not present

## 2021-11-13 DIAGNOSIS — Z9889 Other specified postprocedural states: Secondary | ICD-10-CM | POA: Diagnosis not present

## 2021-11-13 DIAGNOSIS — M722 Plantar fascial fibromatosis: Secondary | ICD-10-CM | POA: Diagnosis not present

## 2021-11-13 DIAGNOSIS — M79672 Pain in left foot: Secondary | ICD-10-CM | POA: Diagnosis not present

## 2021-11-23 DIAGNOSIS — C3412 Malignant neoplasm of upper lobe, left bronchus or lung: Secondary | ICD-10-CM | POA: Diagnosis not present

## 2021-11-23 DIAGNOSIS — C771 Secondary and unspecified malignant neoplasm of intrathoracic lymph nodes: Secondary | ICD-10-CM | POA: Diagnosis not present

## 2021-11-23 DIAGNOSIS — R112 Nausea with vomiting, unspecified: Secondary | ICD-10-CM | POA: Diagnosis not present

## 2021-11-23 DIAGNOSIS — Z452 Encounter for adjustment and management of vascular access device: Secondary | ICD-10-CM | POA: Diagnosis not present

## 2021-11-23 DIAGNOSIS — Z79899 Other long term (current) drug therapy: Secondary | ICD-10-CM | POA: Diagnosis not present

## 2021-11-23 DIAGNOSIS — C3492 Malignant neoplasm of unspecified part of left bronchus or lung: Secondary | ICD-10-CM | POA: Diagnosis not present

## 2021-11-23 DIAGNOSIS — Z006 Encounter for examination for normal comparison and control in clinical research program: Secondary | ICD-10-CM | POA: Diagnosis not present

## 2021-11-23 DIAGNOSIS — R110 Nausea: Secondary | ICD-10-CM | POA: Diagnosis not present

## 2021-11-24 DIAGNOSIS — Z87891 Personal history of nicotine dependence: Secondary | ICD-10-CM | POA: Diagnosis not present

## 2021-11-24 DIAGNOSIS — C3492 Malignant neoplasm of unspecified part of left bronchus or lung: Secondary | ICD-10-CM | POA: Diagnosis not present

## 2021-11-26 DIAGNOSIS — C771 Secondary and unspecified malignant neoplasm of intrathoracic lymph nodes: Secondary | ICD-10-CM | POA: Diagnosis not present

## 2021-11-26 DIAGNOSIS — Z95828 Presence of other vascular implants and grafts: Secondary | ICD-10-CM | POA: Diagnosis not present

## 2021-11-26 DIAGNOSIS — C3412 Malignant neoplasm of upper lobe, left bronchus or lung: Secondary | ICD-10-CM | POA: Diagnosis not present

## 2021-11-26 DIAGNOSIS — Z79899 Other long term (current) drug therapy: Secondary | ICD-10-CM | POA: Diagnosis not present

## 2021-11-27 DIAGNOSIS — R0789 Other chest pain: Secondary | ICD-10-CM | POA: Diagnosis not present

## 2021-11-27 DIAGNOSIS — R110 Nausea: Secondary | ICD-10-CM | POA: Diagnosis not present

## 2021-11-27 DIAGNOSIS — I10 Essential (primary) hypertension: Secondary | ICD-10-CM | POA: Diagnosis not present

## 2021-11-27 DIAGNOSIS — Z9221 Personal history of antineoplastic chemotherapy: Secondary | ICD-10-CM | POA: Diagnosis not present

## 2021-11-27 DIAGNOSIS — R079 Chest pain, unspecified: Secondary | ICD-10-CM | POA: Diagnosis not present

## 2021-11-28 DIAGNOSIS — R531 Weakness: Secondary | ICD-10-CM | POA: Diagnosis not present

## 2021-11-28 DIAGNOSIS — Z7960 Long term (current) use of unspecified immunomodulators and immunosuppressants: Secondary | ICD-10-CM | POA: Diagnosis not present

## 2021-11-28 DIAGNOSIS — Z743 Need for continuous supervision: Secondary | ICD-10-CM | POA: Diagnosis not present

## 2021-11-28 DIAGNOSIS — R638 Other symptoms and signs concerning food and fluid intake: Secondary | ICD-10-CM | POA: Diagnosis not present

## 2021-11-28 DIAGNOSIS — R110 Nausea: Secondary | ICD-10-CM | POA: Diagnosis not present

## 2021-12-01 DIAGNOSIS — C3490 Malignant neoplasm of unspecified part of unspecified bronchus or lung: Secondary | ICD-10-CM | POA: Diagnosis not present

## 2021-12-01 DIAGNOSIS — Z008 Encounter for other general examination: Secondary | ICD-10-CM | POA: Diagnosis not present

## 2021-12-14 DIAGNOSIS — C771 Secondary and unspecified malignant neoplasm of intrathoracic lymph nodes: Secondary | ICD-10-CM | POA: Diagnosis not present

## 2021-12-14 DIAGNOSIS — K439 Ventral hernia without obstruction or gangrene: Secondary | ICD-10-CM | POA: Diagnosis not present

## 2021-12-14 DIAGNOSIS — R4589 Other symptoms and signs involving emotional state: Secondary | ICD-10-CM | POA: Diagnosis not present

## 2021-12-14 DIAGNOSIS — R5383 Other fatigue: Secondary | ICD-10-CM | POA: Diagnosis not present

## 2021-12-14 DIAGNOSIS — C3492 Malignant neoplasm of unspecified part of left bronchus or lung: Secondary | ICD-10-CM | POA: Diagnosis not present

## 2021-12-14 DIAGNOSIS — C3412 Malignant neoplasm of upper lobe, left bronchus or lung: Secondary | ICD-10-CM | POA: Diagnosis not present

## 2021-12-14 DIAGNOSIS — Z006 Encounter for examination for normal comparison and control in clinical research program: Secondary | ICD-10-CM | POA: Diagnosis not present

## 2021-12-14 DIAGNOSIS — R530 Neoplastic (malignant) related fatigue: Secondary | ICD-10-CM | POA: Diagnosis not present

## 2021-12-14 DIAGNOSIS — T451X5A Adverse effect of antineoplastic and immunosuppressive drugs, initial encounter: Secondary | ICD-10-CM | POA: Diagnosis not present

## 2021-12-14 DIAGNOSIS — R531 Weakness: Secondary | ICD-10-CM | POA: Diagnosis not present

## 2021-12-14 DIAGNOSIS — G629 Polyneuropathy, unspecified: Secondary | ICD-10-CM | POA: Diagnosis not present

## 2021-12-14 DIAGNOSIS — R110 Nausea: Secondary | ICD-10-CM | POA: Diagnosis not present

## 2021-12-14 DIAGNOSIS — Z95828 Presence of other vascular implants and grafts: Secondary | ICD-10-CM | POA: Diagnosis not present

## 2021-12-14 DIAGNOSIS — F064 Anxiety disorder due to known physiological condition: Secondary | ICD-10-CM | POA: Diagnosis not present

## 2021-12-17 DIAGNOSIS — Z79899 Other long term (current) drug therapy: Secondary | ICD-10-CM | POA: Diagnosis not present

## 2021-12-17 DIAGNOSIS — C771 Secondary and unspecified malignant neoplasm of intrathoracic lymph nodes: Secondary | ICD-10-CM | POA: Diagnosis not present

## 2021-12-17 DIAGNOSIS — C3412 Malignant neoplasm of upper lobe, left bronchus or lung: Secondary | ICD-10-CM | POA: Diagnosis not present

## 2021-12-20 DIAGNOSIS — J189 Pneumonia, unspecified organism: Secondary | ICD-10-CM | POA: Diagnosis not present

## 2021-12-20 DIAGNOSIS — R051 Acute cough: Secondary | ICD-10-CM | POA: Diagnosis not present

## 2021-12-20 DIAGNOSIS — J90 Pleural effusion, not elsewhere classified: Secondary | ICD-10-CM | POA: Diagnosis not present

## 2021-12-20 DIAGNOSIS — R918 Other nonspecific abnormal finding of lung field: Secondary | ICD-10-CM | POA: Diagnosis not present

## 2021-12-21 ENCOUNTER — Ambulatory Visit (INDEPENDENT_AMBULATORY_CARE_PROVIDER_SITE_OTHER): Payer: Self-pay | Admitting: Podiatry

## 2021-12-21 DIAGNOSIS — Z91199 Patient's noncompliance with other medical treatment and regimen due to unspecified reason: Secondary | ICD-10-CM

## 2021-12-23 NOTE — Progress Notes (Signed)
Patient was no-show for appointment today 

## 2022-01-03 DIAGNOSIS — Z006 Encounter for examination for normal comparison and control in clinical research program: Secondary | ICD-10-CM | POA: Diagnosis not present

## 2022-01-03 DIAGNOSIS — G629 Polyneuropathy, unspecified: Secondary | ICD-10-CM | POA: Diagnosis not present

## 2022-01-03 DIAGNOSIS — R531 Weakness: Secondary | ICD-10-CM | POA: Diagnosis not present

## 2022-01-03 DIAGNOSIS — I10 Essential (primary) hypertension: Secondary | ICD-10-CM | POA: Diagnosis not present

## 2022-01-03 DIAGNOSIS — C771 Secondary and unspecified malignant neoplasm of intrathoracic lymph nodes: Secondary | ICD-10-CM | POA: Diagnosis not present

## 2022-01-03 DIAGNOSIS — R530 Neoplastic (malignant) related fatigue: Secondary | ICD-10-CM | POA: Diagnosis not present

## 2022-01-03 DIAGNOSIS — R7989 Other specified abnormal findings of blood chemistry: Secondary | ICD-10-CM | POA: Diagnosis not present

## 2022-01-03 DIAGNOSIS — R110 Nausea: Secondary | ICD-10-CM | POA: Diagnosis not present

## 2022-01-03 DIAGNOSIS — C3412 Malignant neoplasm of upper lobe, left bronchus or lung: Secondary | ICD-10-CM | POA: Diagnosis not present

## 2022-01-03 DIAGNOSIS — K436 Other and unspecified ventral hernia with obstruction, without gangrene: Secondary | ICD-10-CM | POA: Diagnosis not present

## 2022-01-03 DIAGNOSIS — C3492 Malignant neoplasm of unspecified part of left bronchus or lung: Secondary | ICD-10-CM | POA: Diagnosis not present

## 2022-01-03 DIAGNOSIS — R202 Paresthesia of skin: Secondary | ICD-10-CM | POA: Diagnosis not present

## 2022-01-07 ENCOUNTER — Ambulatory Visit: Payer: No Typology Code available for payment source | Admitting: Podiatry

## 2022-01-07 DIAGNOSIS — Z87891 Personal history of nicotine dependence: Secondary | ICD-10-CM | POA: Diagnosis not present

## 2022-01-07 DIAGNOSIS — E860 Dehydration: Secondary | ICD-10-CM | POA: Diagnosis not present

## 2022-01-07 DIAGNOSIS — C771 Secondary and unspecified malignant neoplasm of intrathoracic lymph nodes: Secondary | ICD-10-CM | POA: Diagnosis not present

## 2022-01-07 DIAGNOSIS — C3412 Malignant neoplasm of upper lobe, left bronchus or lung: Secondary | ICD-10-CM | POA: Diagnosis not present

## 2022-01-19 DIAGNOSIS — L281 Prurigo nodularis: Secondary | ICD-10-CM | POA: Diagnosis not present

## 2022-01-25 DIAGNOSIS — K5900 Constipation, unspecified: Secondary | ICD-10-CM | POA: Diagnosis not present

## 2022-01-25 DIAGNOSIS — I10 Essential (primary) hypertension: Secondary | ICD-10-CM | POA: Diagnosis not present

## 2022-01-25 DIAGNOSIS — Z006 Encounter for examination for normal comparison and control in clinical research program: Secondary | ICD-10-CM | POA: Diagnosis not present

## 2022-01-25 DIAGNOSIS — Z9889 Other specified postprocedural states: Secondary | ICD-10-CM | POA: Diagnosis not present

## 2022-01-25 DIAGNOSIS — Z95828 Presence of other vascular implants and grafts: Secondary | ICD-10-CM | POA: Diagnosis not present

## 2022-01-25 DIAGNOSIS — Z5112 Encounter for antineoplastic immunotherapy: Secondary | ICD-10-CM | POA: Diagnosis not present

## 2022-01-25 DIAGNOSIS — Z79899 Other long term (current) drug therapy: Secondary | ICD-10-CM | POA: Diagnosis not present

## 2022-01-25 DIAGNOSIS — C3492 Malignant neoplasm of unspecified part of left bronchus or lung: Secondary | ICD-10-CM | POA: Diagnosis not present

## 2022-01-25 DIAGNOSIS — Z723 Lack of physical exercise: Secondary | ICD-10-CM | POA: Diagnosis not present

## 2022-01-25 DIAGNOSIS — C771 Secondary and unspecified malignant neoplasm of intrathoracic lymph nodes: Secondary | ICD-10-CM | POA: Diagnosis not present

## 2022-01-25 DIAGNOSIS — M6281 Muscle weakness (generalized): Secondary | ICD-10-CM | POA: Diagnosis not present

## 2022-01-25 DIAGNOSIS — R29898 Other symptoms and signs involving the musculoskeletal system: Secondary | ICD-10-CM | POA: Diagnosis not present

## 2022-01-25 DIAGNOSIS — C3412 Malignant neoplasm of upper lobe, left bronchus or lung: Secondary | ICD-10-CM | POA: Diagnosis not present

## 2022-01-30 ENCOUNTER — Other Ambulatory Visit: Payer: Self-pay | Admitting: Physician Assistant

## 2022-02-01 DIAGNOSIS — M79672 Pain in left foot: Secondary | ICD-10-CM | POA: Diagnosis not present

## 2022-02-01 DIAGNOSIS — L602 Onychogryphosis: Secondary | ICD-10-CM | POA: Diagnosis not present

## 2022-02-01 DIAGNOSIS — L84 Corns and callosities: Secondary | ICD-10-CM | POA: Diagnosis not present

## 2022-02-02 DIAGNOSIS — Z79630 Long term (current) use of alkylating agent: Secondary | ICD-10-CM | POA: Diagnosis not present

## 2022-02-02 DIAGNOSIS — C3412 Malignant neoplasm of upper lobe, left bronchus or lung: Secondary | ICD-10-CM | POA: Diagnosis not present

## 2022-02-02 DIAGNOSIS — Z87891 Personal history of nicotine dependence: Secondary | ICD-10-CM | POA: Diagnosis not present

## 2022-02-02 DIAGNOSIS — R69 Illness, unspecified: Secondary | ICD-10-CM | POA: Diagnosis not present

## 2022-02-02 DIAGNOSIS — Z7960 Long term (current) use of unspecified immunomodulators and immunosuppressants: Secondary | ICD-10-CM | POA: Diagnosis not present

## 2022-02-02 DIAGNOSIS — R519 Headache, unspecified: Secondary | ICD-10-CM | POA: Diagnosis not present

## 2022-02-02 DIAGNOSIS — R5383 Other fatigue: Secondary | ICD-10-CM | POA: Diagnosis not present

## 2022-02-02 DIAGNOSIS — R531 Weakness: Secondary | ICD-10-CM | POA: Diagnosis not present

## 2022-02-02 DIAGNOSIS — Z9221 Personal history of antineoplastic chemotherapy: Secondary | ICD-10-CM | POA: Diagnosis not present

## 2022-02-07 DIAGNOSIS — R69 Illness, unspecified: Secondary | ICD-10-CM | POA: Diagnosis not present

## 2022-02-09 DIAGNOSIS — C3492 Malignant neoplasm of unspecified part of left bronchus or lung: Secondary | ICD-10-CM | POA: Diagnosis not present

## 2022-02-09 DIAGNOSIS — N183 Chronic kidney disease, stage 3 unspecified: Secondary | ICD-10-CM | POA: Diagnosis not present

## 2022-02-09 DIAGNOSIS — G8929 Other chronic pain: Secondary | ICD-10-CM | POA: Diagnosis not present

## 2022-02-09 DIAGNOSIS — R600 Localized edema: Secondary | ICD-10-CM | POA: Diagnosis not present

## 2022-02-09 DIAGNOSIS — I129 Hypertensive chronic kidney disease with stage 1 through stage 4 chronic kidney disease, or unspecified chronic kidney disease: Secondary | ICD-10-CM | POA: Diagnosis not present

## 2022-02-09 DIAGNOSIS — F17201 Nicotine dependence, unspecified, in remission: Secondary | ICD-10-CM | POA: Diagnosis not present

## 2022-02-09 DIAGNOSIS — Z902 Acquired absence of lung [part of]: Secondary | ICD-10-CM | POA: Diagnosis not present

## 2022-02-09 DIAGNOSIS — R29898 Other symptoms and signs involving the musculoskeletal system: Secondary | ICD-10-CM | POA: Diagnosis not present

## 2022-02-09 DIAGNOSIS — M545 Low back pain, unspecified: Secondary | ICD-10-CM | POA: Diagnosis not present

## 2022-02-09 DIAGNOSIS — G609 Hereditary and idiopathic neuropathy, unspecified: Secondary | ICD-10-CM | POA: Diagnosis not present

## 2022-02-10 DIAGNOSIS — Z6835 Body mass index (BMI) 35.0-35.9, adult: Secondary | ICD-10-CM | POA: Diagnosis not present

## 2022-02-10 DIAGNOSIS — E785 Hyperlipidemia, unspecified: Secondary | ICD-10-CM | POA: Diagnosis not present

## 2022-02-10 DIAGNOSIS — I129 Hypertensive chronic kidney disease with stage 1 through stage 4 chronic kidney disease, or unspecified chronic kidney disease: Secondary | ICD-10-CM | POA: Diagnosis not present

## 2022-02-10 DIAGNOSIS — F330 Major depressive disorder, recurrent, mild: Secondary | ICD-10-CM | POA: Diagnosis not present

## 2022-02-10 DIAGNOSIS — G2581 Restless legs syndrome: Secondary | ICD-10-CM | POA: Diagnosis not present

## 2022-02-10 DIAGNOSIS — E559 Vitamin D deficiency, unspecified: Secondary | ICD-10-CM | POA: Diagnosis not present

## 2022-02-10 DIAGNOSIS — N1831 Chronic kidney disease, stage 3a: Secondary | ICD-10-CM | POA: Diagnosis not present

## 2022-02-10 DIAGNOSIS — F419 Anxiety disorder, unspecified: Secondary | ICD-10-CM | POA: Diagnosis not present

## 2022-02-10 DIAGNOSIS — F424 Excoriation (skin-picking) disorder: Secondary | ICD-10-CM | POA: Insufficient documentation

## 2022-02-10 DIAGNOSIS — E66811 Other obesity due to excess calories: Secondary | ICD-10-CM | POA: Insufficient documentation

## 2022-02-10 DIAGNOSIS — K219 Gastro-esophageal reflux disease without esophagitis: Secondary | ICD-10-CM | POA: Diagnosis not present

## 2022-02-14 ENCOUNTER — Ambulatory Visit: Payer: No Typology Code available for payment source | Admitting: Rehabilitative and Restorative Service Providers"

## 2022-02-16 ENCOUNTER — Encounter: Payer: No Typology Code available for payment source | Admitting: Rehabilitative and Restorative Service Providers"

## 2022-02-16 DIAGNOSIS — R69 Illness, unspecified: Secondary | ICD-10-CM | POA: Diagnosis not present

## 2022-02-21 DIAGNOSIS — H6123 Impacted cerumen, bilateral: Secondary | ICD-10-CM | POA: Diagnosis not present

## 2022-02-21 DIAGNOSIS — R69 Illness, unspecified: Secondary | ICD-10-CM | POA: Diagnosis not present

## 2022-02-21 DIAGNOSIS — R42 Dizziness and giddiness: Secondary | ICD-10-CM | POA: Diagnosis not present

## 2022-02-23 DIAGNOSIS — R69 Illness, unspecified: Secondary | ICD-10-CM | POA: Diagnosis not present

## 2022-02-24 DIAGNOSIS — F3341 Major depressive disorder, recurrent, in partial remission: Secondary | ICD-10-CM | POA: Diagnosis not present

## 2022-02-24 DIAGNOSIS — R2689 Other abnormalities of gait and mobility: Secondary | ICD-10-CM | POA: Diagnosis not present

## 2022-02-24 DIAGNOSIS — F424 Excoriation (skin-picking) disorder: Secondary | ICD-10-CM | POA: Diagnosis not present

## 2022-02-24 DIAGNOSIS — I10 Essential (primary) hypertension: Secondary | ICD-10-CM | POA: Diagnosis not present

## 2022-02-28 DIAGNOSIS — N3000 Acute cystitis without hematuria: Secondary | ICD-10-CM | POA: Diagnosis not present

## 2022-02-28 DIAGNOSIS — R300 Dysuria: Secondary | ICD-10-CM | POA: Diagnosis not present

## 2022-02-28 DIAGNOSIS — N3001 Acute cystitis with hematuria: Secondary | ICD-10-CM | POA: Diagnosis not present

## 2022-03-01 DIAGNOSIS — R269 Unspecified abnormalities of gait and mobility: Secondary | ICD-10-CM | POA: Diagnosis not present

## 2022-03-01 DIAGNOSIS — R69 Illness, unspecified: Secondary | ICD-10-CM | POA: Diagnosis not present

## 2022-03-01 DIAGNOSIS — G629 Polyneuropathy, unspecified: Secondary | ICD-10-CM | POA: Diagnosis not present

## 2022-03-01 DIAGNOSIS — R531 Weakness: Secondary | ICD-10-CM | POA: Diagnosis not present

## 2022-03-02 DIAGNOSIS — L281 Prurigo nodularis: Secondary | ICD-10-CM | POA: Diagnosis not present

## 2022-03-02 DIAGNOSIS — L811 Chloasma: Secondary | ICD-10-CM | POA: Diagnosis not present

## 2022-06-30 ENCOUNTER — Other Ambulatory Visit: Payer: Self-pay | Admitting: Physician Assistant

## 2022-08-05 DIAGNOSIS — I700 Atherosclerosis of aorta: Secondary | ICD-10-CM | POA: Insufficient documentation

## 2022-12-22 DIAGNOSIS — I872 Venous insufficiency (chronic) (peripheral): Secondary | ICD-10-CM | POA: Insufficient documentation

## 2022-12-22 DIAGNOSIS — I739 Peripheral vascular disease, unspecified: Secondary | ICD-10-CM | POA: Insufficient documentation

## 2023-01-19 ENCOUNTER — Other Ambulatory Visit: Payer: Self-pay | Admitting: Physician Assistant

## 2023-02-03 DIAGNOSIS — N179 Acute kidney failure, unspecified: Secondary | ICD-10-CM | POA: Insufficient documentation

## 2023-02-03 DIAGNOSIS — E785 Hyperlipidemia, unspecified: Secondary | ICD-10-CM | POA: Insufficient documentation

## 2023-02-03 DIAGNOSIS — J9601 Acute respiratory failure with hypoxia: Secondary | ICD-10-CM | POA: Insufficient documentation

## 2023-03-30 ENCOUNTER — Ambulatory Visit: Payer: No Typology Code available for payment source | Admitting: Podiatry

## 2023-03-31 ENCOUNTER — Ambulatory Visit: Payer: No Typology Code available for payment source | Admitting: Podiatry

## 2023-04-06 DIAGNOSIS — G8929 Other chronic pain: Secondary | ICD-10-CM | POA: Insufficient documentation

## 2023-04-14 ENCOUNTER — Ambulatory Visit: Payer: No Typology Code available for payment source | Admitting: Podiatry

## 2023-04-14 ENCOUNTER — Encounter: Payer: Self-pay | Admitting: Podiatry

## 2023-04-14 DIAGNOSIS — L84 Corns and callosities: Secondary | ICD-10-CM

## 2023-04-14 DIAGNOSIS — N1831 Chronic kidney disease, stage 3a: Secondary | ICD-10-CM | POA: Diagnosis not present

## 2023-04-14 DIAGNOSIS — M79675 Pain in left toe(s): Secondary | ICD-10-CM

## 2023-04-14 DIAGNOSIS — M79674 Pain in right toe(s): Secondary | ICD-10-CM

## 2023-04-14 DIAGNOSIS — G609 Hereditary and idiopathic neuropathy, unspecified: Secondary | ICD-10-CM

## 2023-04-14 DIAGNOSIS — B351 Tinea unguium: Secondary | ICD-10-CM

## 2023-04-14 NOTE — Progress Notes (Signed)
  Subjective:  Patient ID: Yvonne Stewart, female    DOB: Jun 13, 1946,   MRN: [id number]  No chief complaint on file.   77 y.o. female presents for concern of bilateral calluses and painful thickened and elongated toenails. Relates the left foot callus is very painful. She has had the calluses for years. Also requesting her toenails to be trimmed.  . Denies any other pedal complaints. Denies n/v/f/c.   Past Medical History:  Diagnosis Date   Anxiety    Depression    GERD (gastroesophageal reflux disease)    Restless leg syndrome     Objective:  Physical Exam: Vascular: DP/PT pulses 2/4 bilateral. CFT <3 seconds. Normal hair growth on digits. No edema.  Skin. No lacerations or abrasions bilateral feet. Hyperkeratotic lesion noted sub first met head on the left, pre-ulcerative. Nails 1-5 are thickened discolored and elongated with subungual debris.  Musculoskeletal: MMT 5/5 bilateral lower extremities in DF, PF, Inversion and Eversion. Deceased ROM in DF of ankle joint. Tender to hyperkeratotic lesion.  Neurological: Sensation intact to light touch. Decreased protective sensation.   Assessment:   1. Pre-ulcerative calluses   2. Idiopathic peripheral neuropathy   3. Pain due to onychomycosis of toenails of both feet   4. Chronic kidney disease, stage 3a (HCC)      Plan:  Patient was evaluated and treated and all questions answered. -Discussed and educated patient on foot care, especially with  regards to the vascular, neurological and musculoskeletal systems.  -Discussed supportive shoes at all times and checking feet regularly.  -Mechanically debrided all nails 1-5 bilateral using sterile nail nipper and filed with dremel without incident  -Hyperkeratotic lesion debrided without incident. Padding dispensed to offload area.  -Answered all patient questions -Patient to return  in 3 months for at risk foot care -Patient advised to call the office if any problems or questions  arise in the meantime.   Louann Sjogren, DPM

## 2023-05-02 DIAGNOSIS — R262 Difficulty in walking, not elsewhere classified: Secondary | ICD-10-CM | POA: Insufficient documentation

## 2023-05-10 ENCOUNTER — Other Ambulatory Visit: Payer: Self-pay | Admitting: Physician Assistant

## 2023-05-24 DIAGNOSIS — R0989 Other specified symptoms and signs involving the circulatory and respiratory systems: Secondary | ICD-10-CM | POA: Insufficient documentation

## 2023-06-16 ENCOUNTER — Ambulatory Visit: Payer: No Typology Code available for payment source | Admitting: Podiatry

## 2023-06-22 ENCOUNTER — Ambulatory Visit (INDEPENDENT_AMBULATORY_CARE_PROVIDER_SITE_OTHER): Payer: No Typology Code available for payment source | Admitting: Podiatry

## 2023-06-22 ENCOUNTER — Encounter: Payer: Self-pay | Admitting: Podiatry

## 2023-06-22 DIAGNOSIS — M79675 Pain in left toe(s): Secondary | ICD-10-CM | POA: Diagnosis not present

## 2023-06-22 DIAGNOSIS — L84 Corns and callosities: Secondary | ICD-10-CM | POA: Diagnosis not present

## 2023-06-22 DIAGNOSIS — M79674 Pain in right toe(s): Secondary | ICD-10-CM

## 2023-06-22 DIAGNOSIS — B351 Tinea unguium: Secondary | ICD-10-CM

## 2023-06-22 DIAGNOSIS — G609 Hereditary and idiopathic neuropathy, unspecified: Secondary | ICD-10-CM

## 2023-06-22 NOTE — Progress Notes (Signed)
  Subjective:  Patient ID: Yvonne Stewart, female    DOB: [birth year],   MRN: [id number]  Chief Complaint  Patient presents with   Nail Problem    rfc    77 y.o. female presents for concern of  calluses and painful thickened and elongated toenails. Relates the left foot callus is very painful. She has had the calluses for years. Also requesting her toenails to be trimmed.  . Denies any other pedal complaints. Denies n/v/f/c.   Past Medical History:  Diagnosis Date   Anxiety    Depression    GERD (gastroesophageal reflux disease)    Restless leg syndrome      Objective:  Physical Exam: Vascular: DP/PT pulses 2/4 bilateral. CFT <3 seconds. Normal hair growth on digits. No edema.  Skin. No lacerations or abrasions bilateral feet. Hyperkeratotic lesion noted sub first met head on the left, pre-ulcerative. New hyperkeratotic lesion noted sub fourth metatarsal head with cored area. Nails 1-5 are thickened discolored and elongated with subungual debris.  Musculoskeletal: MMT 5/5 bilateral lower extremities in DF, PF, Inversion and Eversion. Deceased ROM in DF of ankle joint. Tender to hyperkeratotic lesion.  Neurological: Sensation intact to light touch. Decreased protective sensation.   Assessment:   1. Pre-ulcerative calluses   2. Idiopathic peripheral neuropathy   3. Pain due to onychomycosis of toenails of both feet       Plan:  Patient was evaluated and treated and all questions answered. -Discussed and educated patient on foot care, especially with  regards to the vascular, neurological and musculoskeletal systems.  -Discussed supportive shoes at all times and checking feet regularly.  -Mechanically debrided all nails 1-5 bilateral using sterile nail nipper and filed with dremel without incident  -Hyperkeratotic lesion debrided without incident. Padding dispensed to offload area.  -Answered all patient questions -Patient to return  in 3 months for at risk foot  care -Patient advised to call the office if any problems or questions arise in the meantime.   Louann Sjogren, DPM

## 2023-08-03 ENCOUNTER — Encounter: Payer: Self-pay | Admitting: Podiatry

## 2023-08-03 ENCOUNTER — Ambulatory Visit (INDEPENDENT_AMBULATORY_CARE_PROVIDER_SITE_OTHER): Payer: No Typology Code available for payment source | Admitting: Podiatry

## 2023-08-03 DIAGNOSIS — L84 Corns and callosities: Secondary | ICD-10-CM

## 2023-08-03 DIAGNOSIS — M79675 Pain in left toe(s): Secondary | ICD-10-CM

## 2023-08-03 DIAGNOSIS — M79674 Pain in right toe(s): Secondary | ICD-10-CM

## 2023-08-03 DIAGNOSIS — G609 Hereditary and idiopathic neuropathy, unspecified: Secondary | ICD-10-CM

## 2023-08-03 DIAGNOSIS — B351 Tinea unguium: Secondary | ICD-10-CM | POA: Diagnosis not present

## 2023-08-03 NOTE — Progress Notes (Signed)
  Subjective:  Patient ID: Yvonne Stewart, female    DOB: 1945-10-09,   MRN: [id number]  No chief complaint on file.   78 y.o. female presents for concern of  calluses and painful thickened and elongated toenails. Relates the left foot callus is very painful. She has had the calluses for years. Also requesting her toenails to be trimmed.  . Denies any other pedal complaints. Denies n/v/f/c.   Past Medical History:  Diagnosis Date   Anxiety    Depression    GERD (gastroesophageal reflux disease)    Restless leg syndrome      Objective:  Physical Exam: Vascular: DP/PT pulses 2/4 bilateral. CFT <3 seconds. Normal hair growth on digits. No edema.  Skin. No lacerations or abrasions bilateral feet. Hyperkeratotic lesion noted sub first met head on the left, pre-ulcerative. New hyperkeratotic lesion noted sub fourth metatarsal head with cored area. Nails 1-5 are thickened discolored and elongated with subungual debris.  Musculoskeletal: MMT 5/5 bilateral lower extremities in DF, PF, Inversion and Eversion. Deceased ROM in DF of ankle joint. Tender to hyperkeratotic lesion.  Neurological: Sensation intact to light touch. Decreased protective sensation.   Assessment:   1. Pre-ulcerative calluses   2. Idiopathic peripheral neuropathy   3. Pain due to onychomycosis of toenails of both feet        Plan:  Patient was evaluated and treated and all questions answered. -Patient here early ABN signed.  -Discussed and educated patient on foot care, especially with  regards to the vascular, neurological and musculoskeletal systems.  -Discussed supportive shoes at all times and checking feet regularly.  -Mechanically debrided all nails 1-5 bilateral using sterile nail nipper and filed with dremel without incident  -Hyperkeratotic lesion debrided without incident. Padding dispensed to offload area.  -Answered all patient questions -Patient to return  in 6 weeks for rfc.  -Patient advised  to call the office if any problems or questions arise in the meantime.   Louann Sjogren, DPM

## 2023-08-12 DIAGNOSIS — M7551 Bursitis of right shoulder: Secondary | ICD-10-CM | POA: Insufficient documentation

## 2023-09-08 ENCOUNTER — Ambulatory Visit (INDEPENDENT_AMBULATORY_CARE_PROVIDER_SITE_OTHER): Payer: Medicare (Managed Care) | Admitting: Podiatry

## 2023-09-08 ENCOUNTER — Encounter: Payer: Self-pay | Admitting: Podiatry

## 2023-09-08 DIAGNOSIS — L84 Corns and callosities: Secondary | ICD-10-CM | POA: Diagnosis not present

## 2023-09-08 DIAGNOSIS — M79675 Pain in left toe(s): Secondary | ICD-10-CM | POA: Diagnosis not present

## 2023-09-08 DIAGNOSIS — B351 Tinea unguium: Secondary | ICD-10-CM

## 2023-09-08 DIAGNOSIS — G609 Hereditary and idiopathic neuropathy, unspecified: Secondary | ICD-10-CM | POA: Diagnosis not present

## 2023-09-08 DIAGNOSIS — M79674 Pain in right toe(s): Secondary | ICD-10-CM

## 2023-09-08 NOTE — Progress Notes (Signed)
  Subjective:  Patient ID: Yvonne Stewart, female    DOB: 1946/03/14,   MRN: [id number]  No chief complaint on file.   78 y.o. female presents for concern of  calluses and painful thickened and elongated toenails. Relates the left foot callus is very painful. She has had the calluses for years. Also requesting her toenails to be trimmed.  . Denies any other pedal complaints. Denies n/v/f/c.   Past Medical History:  Diagnosis Date   Anxiety    Depression    GERD (gastroesophageal reflux disease)    Restless leg syndrome      Objective:  Physical Exam: Vascular: DP/PT pulses 2/4 bilateral. CFT <3 seconds. Normal hair growth on digits. No edema.  Skin. No lacerations or abrasions bilateral feet. Hyperkeratotic lesion noted sub first met head on the left, pre-ulcerative. New hyperkeratotic lesion noted sub fourth metatarsal head with cored area. Nails 1-5 are thickened discolored and elongated with subungual debris.  Musculoskeletal: MMT 5/5 bilateral lower extremities in DF, PF, Inversion and Eversion. Deceased ROM in DF of ankle joint. Tender to hyperkeratotic lesion.  Neurological: Sensation intact to light touch. Decreased protective sensation.   Assessment:   1. Pre-ulcerative calluses   2. Idiopathic peripheral neuropathy        Plan:  Patient was evaluated and treated and all questions answered. -Patient here early ABN signed.  -Discussed and educated patient on foot care, especially with  regards to the vascular, neurological and musculoskeletal systems.  -Discussed supportive shoes at all times and checking feet regularly.  -Mechanically debrided all nails 1-5 bilateral using sterile nail nipper and filed with dremel without incident  -Hyperkeratotic lesions x 2 to left foot debrided without incident. Padding dispensed to offload area.  -Answered all patient questions -Patient to return  in 6 weeks for rfc.  -Patient advised to call the office if any problems or  questions arise in the meantime.   Louann Sjogren, DPM

## 2023-09-28 ENCOUNTER — Ambulatory Visit: Payer: No Typology Code available for payment source | Admitting: Podiatry

## 2023-10-19 ENCOUNTER — Ambulatory Visit: Payer: Medicare (Managed Care) | Admitting: Podiatry

## 2023-10-19 ENCOUNTER — Encounter: Payer: Self-pay | Admitting: Podiatry

## 2023-10-19 DIAGNOSIS — G609 Hereditary and idiopathic neuropathy, unspecified: Secondary | ICD-10-CM

## 2023-10-19 DIAGNOSIS — M79675 Pain in left toe(s): Secondary | ICD-10-CM

## 2023-10-19 DIAGNOSIS — B351 Tinea unguium: Secondary | ICD-10-CM | POA: Diagnosis not present

## 2023-10-19 DIAGNOSIS — L84 Corns and callosities: Secondary | ICD-10-CM

## 2023-10-19 DIAGNOSIS — M79674 Pain in right toe(s): Secondary | ICD-10-CM | POA: Diagnosis not present

## 2023-10-19 NOTE — Progress Notes (Signed)
  Subjective:  Patient ID: Yvonne Stewart, female    DOB: 11/08/1945,   MRN: [id number]  No chief complaint on file.   78 y.o. female presents for concern of  calluses and painful thickened and elongated toenails. Relates the left foot callus is very painful. She has had the calluses for years. Also requesting her toenails to be trimmed.  . Denies any other pedal complaints. Denies n/v/f/c.   Past Medical History:  Diagnosis Date   Anxiety    Depression    GERD (gastroesophageal reflux disease)    Restless leg syndrome      Objective:  Physical Exam: Vascular: DP/PT pulses 2/4 bilateral. CFT <3 seconds. Normal hair growth on digits. No edema.  Skin. No lacerations or abrasions bilateral feet. Hyperkeratotic lesion noted sub first met head on the left, pre-ulcerative. New hyperkeratotic lesion noted sub fourth metatarsal head with cored area. Nails 1-5 are thickened discolored and elongated with subungual debris.  Musculoskeletal: MMT 5/5 bilateral lower extremities in DF, PF, Inversion and Eversion. Deceased ROM in DF of ankle joint. Tender to hyperkeratotic lesion.  Neurological: Sensation intact to light touch. Decreased protective sensation.   Assessment:   1. Pre-ulcerative calluses   2. Idiopathic peripheral neuropathy   3. Pain due to onychomycosis of toenails of both feet         Plan:  Patient was evaluated and treated and all questions answered. -Patient here early ABN signed.  -Discussed and educated patient on foot care, especially with  regards to the vascular, neurological and musculoskeletal systems.  -Discussed supportive shoes at all times and checking feet regularly.  -Mechanically debrided all nails 1-5 bilateral using sterile nail nipper and filed with dremel without incident  -Hyperkeratotic lesions x 2 to left foot debrided without incident. Padding dispensed to offload area.  -Answered all patient questions -Patient to return  in 6 weeks for rfc.   -Patient advised to call the office if any problems or questions arise in the meantime.   Jennefer Moats, DPM

## 2023-10-28 ENCOUNTER — Other Ambulatory Visit: Payer: Self-pay | Admitting: Physician Assistant

## 2023-12-07 ENCOUNTER — Encounter: Payer: Self-pay | Admitting: Podiatry

## 2023-12-07 ENCOUNTER — Ambulatory Visit (INDEPENDENT_AMBULATORY_CARE_PROVIDER_SITE_OTHER): Payer: Medicare (Managed Care) | Admitting: Podiatry

## 2023-12-07 DIAGNOSIS — B351 Tinea unguium: Secondary | ICD-10-CM | POA: Diagnosis not present

## 2023-12-07 DIAGNOSIS — L84 Corns and callosities: Secondary | ICD-10-CM | POA: Diagnosis not present

## 2023-12-07 DIAGNOSIS — M79675 Pain in left toe(s): Secondary | ICD-10-CM

## 2023-12-07 DIAGNOSIS — G609 Hereditary and idiopathic neuropathy, unspecified: Secondary | ICD-10-CM

## 2023-12-07 DIAGNOSIS — M79674 Pain in right toe(s): Secondary | ICD-10-CM

## 2023-12-07 NOTE — Progress Notes (Addendum)
  Subjective:  Patient ID: Yvonne Stewart, female    DOB: 01/05/1946,   MRN: [id number]  Chief Complaint  Patient presents with   Callouses    "Callus removal and I need callus pads."    78 y.o. female presents for concern of  calluses and painful thickened and elongated toenails. Relates the left foot callus is very painful. She has had the calluses for years. Also requesting her toenails to be trimmed.  . Denies any other pedal complaints. Denies n/v/f/c.   Past Medical History:  Diagnosis Date   Anxiety    Depression    GERD (gastroesophageal reflux disease)    Restless leg syndrome      Objective:  Physical Exam: Vascular: DP/PT pulses 2/4 bilateral. CFT <3 seconds. Normal hair growth on digits. No edema.  Skin. No lacerations or abrasions bilateral feet. Hyperkeratotic lesion noted sub first met head on the left, pre-ulcerative. New hyperkeratotic lesion noted sub fourth metatarsal head with cored area. Nails 1-5 are thickened discolored and elongated with subungual debris.  Musculoskeletal: MMT 5/5 bilateral lower extremities in DF, PF, Inversion and Eversion. Deceased ROM in DF of ankle joint. Tender to hyperkeratotic lesion.  Neurological: Sensation intact to light touch. Decreased protective sensation.   Assessment:   1. Pre-ulcerative calluses   2. Idiopathic peripheral neuropathy   3. Pain due to onychomycosis of toenails of both feet         Plan:  Patient was evaluated and treated and all questions answered. -Patient here early ABN signed.  -Discussed and educated patient on foot care, especially with  regards to the vascular, neurological and musculoskeletal systems.  -Discussed supportive shoes at all times and checking feet regularly.  -Mechanically debrided all nails 1-5 bilateral using sterile nail nipper and filed with dremel without incident  -Hyperkeratotic lesions x 2 to left foot debrided without incident. Padding dispensed to offload area.   -Answered all patient questions -Patient to return  in 6 weeks for rfc.  -Patient advised to call the office if any problems or questions arise in the meantime.   Jennefer Moats, DPM

## 2024-01-18 ENCOUNTER — Ambulatory Visit: Payer: Medicare (Managed Care) | Admitting: Podiatry

## 2024-01-18 ENCOUNTER — Encounter: Payer: Self-pay | Admitting: Podiatry

## 2024-01-18 DIAGNOSIS — M79674 Pain in right toe(s): Secondary | ICD-10-CM | POA: Diagnosis not present

## 2024-01-18 DIAGNOSIS — G609 Hereditary and idiopathic neuropathy, unspecified: Secondary | ICD-10-CM | POA: Diagnosis not present

## 2024-01-18 DIAGNOSIS — M79675 Pain in left toe(s): Secondary | ICD-10-CM | POA: Diagnosis not present

## 2024-01-18 DIAGNOSIS — L84 Corns and callosities: Secondary | ICD-10-CM

## 2024-01-18 DIAGNOSIS — B351 Tinea unguium: Secondary | ICD-10-CM

## 2024-01-18 NOTE — Progress Notes (Signed)
  Subjective:  Patient ID: Yvonne Stewart, female    DOB: April 28, 1946,   MRN: [id number]  Chief Complaint  Patient presents with   Nail Problem    78 y.o. female presents for concern of  calluses and painful thickened and elongated toenails. Relates the left foot callus is very painful. She has had the calluses for years. Also requesting her toenails to be trimmed.  History of PVD. Denies any other pedal complaints. Denies n/v/f/c.   Past Medical History:  Diagnosis Date   Anxiety    Depression    GERD (gastroesophageal reflux disease)    Restless leg syndrome      Objective:  Physical Exam: Vascular: DP/PT pulses 2/4 bilateral. CFT <3 seconds. Normal hair growth on digits. No edema.  Skin. No lacerations or abrasions bilateral feet. Hyperkeratotic lesion noted sub first met head on the left, pre-ulcerative. New hyperkeratotic lesion noted sub fourth metatarsal head with cored area. Nails 1-5 are thickened discolored and elongated with subungual debris.  Musculoskeletal: MMT 5/5 bilateral lower extremities in DF, PF, Inversion and Eversion. Deceased ROM in DF of ankle joint. Tender to hyperkeratotic lesion.  Neurological: Sensation intact to light touch. Decreased protective sensation.   Assessment:   1. Pre-ulcerative calluses   2. Idiopathic peripheral neuropathy   3. Pain due to onychomycosis of toenails of both feet         Plan:  Patient was evaluated and treated and all questions answered. -Patient here early ABN signed.  -Discussed and educated patient on foot care, especially with  regards to the vascular, neurological and musculoskeletal systems.  -Discussed supportive shoes at all times and checking feet regularly.  -Mechanically debrided all nails 1-5 bilateral using sterile nail nipper and filed with dremel without incident  -Hyperkeratotic lesions x 2 to left foot debrided without incident. Padding dispensed to offload area.  -Answered all patient  questions -Patient to return  in 6 weeks for rfc.  -Patient advised to call the office if any problems or questions arise in the meantime.   Asberry Failing, DPM

## 2024-02-29 ENCOUNTER — Ambulatory Visit: Payer: Medicare (Managed Care) | Admitting: Podiatry

## 2024-03-14 ENCOUNTER — Ambulatory Visit (INDEPENDENT_AMBULATORY_CARE_PROVIDER_SITE_OTHER): Payer: Medicare (Managed Care) | Admitting: Podiatry

## 2024-03-14 ENCOUNTER — Encounter: Payer: Self-pay | Admitting: Podiatry

## 2024-03-14 DIAGNOSIS — L84 Corns and callosities: Secondary | ICD-10-CM | POA: Diagnosis not present

## 2024-03-14 DIAGNOSIS — B351 Tinea unguium: Secondary | ICD-10-CM | POA: Diagnosis not present

## 2024-03-14 DIAGNOSIS — M79675 Pain in left toe(s): Secondary | ICD-10-CM | POA: Diagnosis not present

## 2024-03-14 DIAGNOSIS — G609 Hereditary and idiopathic neuropathy, unspecified: Secondary | ICD-10-CM

## 2024-03-14 DIAGNOSIS — M79674 Pain in right toe(s): Secondary | ICD-10-CM | POA: Diagnosis not present

## 2024-03-14 NOTE — Progress Notes (Signed)
  Subjective:  Patient ID: Yvonne Stewart, female    DOB: [birth year],   MRN: [id number]  Chief Complaint  Patient presents with   Callouses    Callus removal    78 y.o. female presents for concern of  calluses and painful thickened and elongated toenails. Relates the left foot callus is very painful. She has had the calluses for years. Also requesting her toenails to be trimmed.  History of PVD. Denies any other pedal complaints. Denies n/v/f/c.   Past Medical History:  Diagnosis Date   Anxiety    Depression    GERD (gastroesophageal reflux disease)    Restless leg syndrome      Objective:  Physical Exam: Vascular: DP/PT pulses 2/4 bilateral. CFT <3 seconds. Normal hair growth on digits. No edema.  Skin. No lacerations or abrasions bilateral feet. Hyperkeratotic lesion noted sub first met head on the left, pre-ulcerative. New hyperkeratotic lesion noted sub fourth metatarsal head with cored area. Nails 1-5 are thickened discolored and elongated with subungual debris.  Musculoskeletal: MMT 5/5 bilateral lower extremities in DF, PF, Inversion and Eversion. Deceased ROM in DF of ankle joint. Tender to hyperkeratotic lesion.  Neurological: Sensation intact to light touch. Decreased protective sensation.   Assessment:   1. Pre-ulcerative calluses   2. Pain due to onychomycosis of toenails of both feet   3. Idiopathic peripheral neuropathy         Plan:  Patient was evaluated and treated and all questions answered. -Patient here early ABN signed.  -Discussed and educated patient on foot care, especially with  regards to the vascular, neurological and musculoskeletal systems.  -Discussed supportive shoes at all times and checking feet regularly.  -Mechanically debrided all nails 1-5 bilateral using sterile nail nipper and filed with dremel without incident  -Hyperkeratotic lesions x 2 to left foot debrided without incident. Padding dispensed to offload area.  -Answered  all patient questions -Patient to return  in 6 weeks for rfc.  -Patient advised to call the office if any problems or questions arise in the meantime.   Asberry Failing, DPM

## 2024-04-25 ENCOUNTER — Encounter: Payer: Self-pay | Admitting: Podiatry

## 2024-04-25 ENCOUNTER — Ambulatory Visit: Payer: Medicare (Managed Care) | Admitting: Podiatry

## 2024-04-25 DIAGNOSIS — M79675 Pain in left toe(s): Secondary | ICD-10-CM | POA: Diagnosis not present

## 2024-04-25 DIAGNOSIS — M79674 Pain in right toe(s): Secondary | ICD-10-CM | POA: Diagnosis not present

## 2024-04-25 DIAGNOSIS — G609 Hereditary and idiopathic neuropathy, unspecified: Secondary | ICD-10-CM

## 2024-04-25 DIAGNOSIS — L84 Corns and callosities: Secondary | ICD-10-CM | POA: Diagnosis not present

## 2024-04-25 DIAGNOSIS — B351 Tinea unguium: Secondary | ICD-10-CM

## 2024-04-25 NOTE — Progress Notes (Signed)
  Subjective:  Patient ID: Yvonne Stewart, female    DOB: 27-Dec-1945,   MRN: [id number]  Chief Complaint  Patient presents with   Callouses    Routine callus removal    78 y.o. female presents for concern of  calluses and painful thickened and elongated toenails. Relates the left foot callus is very painful. She has had the calluses for years. Also requesting her toenails to be trimmed.  History of PVD. Denies any other pedal complaints. Denies n/v/f/c.   Past Medical History:  Diagnosis Date   Anxiety    Depression    GERD (gastroesophageal reflux disease)    Restless leg syndrome      Objective:  Physical Exam: Vascular: DP/PT pulses 2/4 bilateral. CFT <3 seconds. Normal hair growth on digits. No edema.  Skin. No lacerations or abrasions bilateral feet. Hyperkeratotic lesion noted sub first met head on the left, pre-ulcerative. New hyperkeratotic lesion noted sub fourth metatarsal head with cored area. Nails 1-5 are thickened discolored and elongated with subungual debris.  Musculoskeletal: MMT 5/5 bilateral lower extremities in DF, PF, Inversion and Eversion. Deceased ROM in DF of ankle joint. Tender to hyperkeratotic lesion.  Neurological: Sensation intact to light touch. Decreased protective sensation.   Assessment:   1. Pre-ulcerative calluses   2. Pain due to onychomycosis of toenails of both feet   3. Idiopathic peripheral neuropathy          Plan:  Patient was evaluated and treated and all questions answered. -ABN signed previously. -Discussed and educated patient on foot care, especially with  regards to the vascular, neurological and musculoskeletal systems.  -Discussed supportive shoes at all times and checking feet regularly.  -Mechanically debrided all nails 1-5 bilateral using sterile nail nipper and filed with dremel without incident  -Hyperkeratotic lesions x 2 to left foot debrided without incident. Padding dispensed to offload area.  -Answered  all patient questions -Patient to return  in 6 weeks for rfc.  -Patient advised to call the office if any problems or questions arise in the meantime.   Asberry Failing, DPM

## 2024-05-22 ENCOUNTER — Encounter: Payer: Self-pay | Admitting: Podiatry

## 2024-05-22 ENCOUNTER — Ambulatory Visit: Payer: Medicare (Managed Care) | Admitting: Podiatry

## 2024-05-22 DIAGNOSIS — G609 Hereditary and idiopathic neuropathy, unspecified: Secondary | ICD-10-CM | POA: Diagnosis not present

## 2024-05-22 DIAGNOSIS — L84 Corns and callosities: Secondary | ICD-10-CM

## 2024-05-22 NOTE — Progress Notes (Signed)
  Subjective:  Patient ID: Yvonne Stewart, female    DOB: Aug 29, 1945,   MRN: [id number]  Chief Complaint  Patient presents with   Callouses    Callus on my left foot    78 y.o. female presents for concern of  calluses and painful thickened and elongated toenails. Relates the left foot callus is very painful. She has had the calluses for years.  History of PVD. Denies any other pedal complaints. Denies n/v/f/c.   Past Medical History:  Diagnosis Date   Anxiety    Depression    GERD (gastroesophageal reflux disease)    Restless leg syndrome      Objective:  Physical Exam: Vascular: DP/PT pulses 2/4 bilateral. CFT <3 seconds. Normal hair growth on digits. No edema.  Skin. No lacerations or abrasions bilateral feet. Hyperkeratotic lesion noted sub first met head on the left, pre-ulcerative. New hyperkeratotic lesion noted sub fourth metatarsal head with cored area. Nails 1-5 are thickened discolored and elongated with subungual debris.  Musculoskeletal: MMT 5/5 bilateral lower extremities in DF, PF, Inversion and Eversion. Deceased ROM in DF of ankle joint. Tender to hyperkeratotic lesion.  Neurological: Sensation intact to light touch. Decreased protective sensation.   Assessment:   1. Pre-ulcerative calluses   2. Idiopathic peripheral neuropathy          Plan:  Patient was evaluated and treated and all questions answered. -ABN signed previously. -Discussed and educated patient on foot care, especially with  regards to the vascular, neurological and musculoskeletal systems.  -Discussed supportive shoes at all times and checking feet regularly.  -Hyperkeratotic lesions x 2 to left foot debrided without incident. Padding dispensed to offload area.  -Answered all patient questions -Patient to return  in 6 weeks for rfc.  -Patient advised to call the office if any problems or questions arise in the meantime.   Asberry Failing, DPM

## 2024-06-20 ENCOUNTER — Ambulatory Visit: Payer: Medicare (Managed Care) | Admitting: Podiatry

## 2024-07-05 ENCOUNTER — Ambulatory Visit: Payer: Medicare (Managed Care) | Admitting: Podiatry

## 2024-07-22 ENCOUNTER — Ambulatory Visit: Payer: Medicare (Managed Care) | Admitting: Podiatry

## 2024-07-22 ENCOUNTER — Encounter: Payer: Self-pay | Admitting: Podiatry

## 2024-07-22 DIAGNOSIS — L84 Corns and callosities: Secondary | ICD-10-CM

## 2024-07-22 DIAGNOSIS — B351 Tinea unguium: Secondary | ICD-10-CM

## 2024-07-22 DIAGNOSIS — G609 Hereditary and idiopathic neuropathy, unspecified: Secondary | ICD-10-CM

## 2024-07-22 DIAGNOSIS — M79674 Pain in right toe(s): Secondary | ICD-10-CM | POA: Diagnosis not present

## 2024-07-22 DIAGNOSIS — M79675 Pain in left toe(s): Secondary | ICD-10-CM | POA: Diagnosis not present

## 2024-07-22 NOTE — Progress Notes (Signed)
"  °  Subjective:  Patient ID: Yvonne Stewart, female    DOB: 1945-11-27,   MRN: [id number]  Chief Complaint  Patient presents with   RFC    RFC, calluses on Left foot.    79 y.o. female presents for concern of  calluses and painful thickened and elongated toenails. Relates the left foot callus is very painful. She has had the calluses for years.  History of PVD. Denies any other pedal complaints. Denies n/v/f/c.   Past Medical History:  Diagnosis Date   Anxiety    Depression    GERD (gastroesophageal reflux disease)    Restless leg syndrome      Objective:  Physical Exam: Vascular: DP/PT pulses 2/4 bilateral. CFT <3 seconds. Normal hair growth on digits. No edema.  Skin. No lacerations or abrasions bilateral feet. Hyperkeratotic lesion noted sub first met head on the left, pre-ulcerative. New hyperkeratotic lesion noted sub fourth metatarsal head with cored area. Nails 1-5 are thickened discolored and elongated with subungual debris.  Musculoskeletal: MMT 5/5 bilateral lower extremities in DF, PF, Inversion and Eversion. Deceased ROM in DF of ankle joint. Tender to hyperkeratotic lesion.  Neurological: Sensation intact to light touch. Decreased protective sensation.   Assessment:   1. Pre-ulcerative calluses   2. Idiopathic peripheral neuropathy   3. Pain due to onychomycosis of toenails of both feet           Plan:  Patient was evaluated and treated and all questions answered. -ABN signed 07/23/23 -Discussed and educated patient on foot care, especially with  regards to the vascular, neurological and musculoskeletal systems.  -Discussed supportive shoes at all times and checking feet regularly.  -Hyperkeratotic lesions x 2 to left foot debrided without incident. Padding dispensed to offload area.  -Answered all patient questions -Patient to return  in 6 weeks for rfc.  -Patient advised to call the office if any problems or questions arise in the  meantime.   Asberry Failing, DPM    "

## 2024-09-02 ENCOUNTER — Ambulatory Visit: Payer: Medicare (Managed Care) | Admitting: Podiatry
